# Patient Record
Sex: Male | Born: 1945 | Race: White | Hispanic: No | Marital: Single | Smoking: Former smoker
Health system: Southern US, Community
[De-identification: ages and names within clinical notes are randomized; demographics above are authoritative.]

## PROBLEM LIST (undated history)

## (undated) DIAGNOSIS — Z789 Other specified health status: Secondary | ICD-10-CM

## (undated) DIAGNOSIS — C61 Malignant neoplasm of prostate: Secondary | ICD-10-CM

## (undated) DIAGNOSIS — Z86010 Personal history of colonic polyps: Secondary | ICD-10-CM

## (undated) DIAGNOSIS — Z973 Presence of spectacles and contact lenses: Secondary | ICD-10-CM

## (undated) DIAGNOSIS — Z860101 Personal history of adenomatous and serrated colon polyps: Secondary | ICD-10-CM

## (undated) HISTORY — PX: COLONOSCOPY: SHX174

## (undated) HISTORY — PX: NO PAST SURGERIES: SHX2092

## (undated) HISTORY — DX: Other specified health status: Z78.9

## (undated) HISTORY — PX: PROSTATE BIOPSY: SHX241

---

## 1997-11-21 ENCOUNTER — Other Ambulatory Visit: Admission: RE | Admit: 1997-11-21 | Discharge: 1997-11-21 | Payer: Self-pay | Admitting: Family Medicine

## 1998-01-27 ENCOUNTER — Ambulatory Visit (HOSPITAL_COMMUNITY): Admission: RE | Admit: 1998-01-27 | Discharge: 1998-01-27 | Payer: Self-pay | Admitting: Gastroenterology

## 2001-05-31 ENCOUNTER — Emergency Department (HOSPITAL_COMMUNITY): Admission: EM | Admit: 2001-05-31 | Discharge: 2001-05-31 | Payer: Self-pay | Admitting: Emergency Medicine

## 2001-06-14 ENCOUNTER — Emergency Department (HOSPITAL_COMMUNITY): Admission: EM | Admit: 2001-06-14 | Discharge: 2001-06-14 | Payer: Self-pay | Admitting: Emergency Medicine

## 2001-07-31 ENCOUNTER — Ambulatory Visit (HOSPITAL_COMMUNITY): Admission: RE | Admit: 2001-07-31 | Discharge: 2001-07-31 | Payer: Self-pay | Admitting: Gastroenterology

## 2001-07-31 ENCOUNTER — Encounter (INDEPENDENT_AMBULATORY_CARE_PROVIDER_SITE_OTHER): Payer: Self-pay

## 2002-02-12 ENCOUNTER — Encounter: Admission: RE | Admit: 2002-02-12 | Discharge: 2002-02-12 | Payer: Self-pay | Admitting: Family Medicine

## 2002-02-12 ENCOUNTER — Encounter: Payer: Self-pay | Admitting: Family Medicine

## 2005-01-05 ENCOUNTER — Ambulatory Visit (HOSPITAL_COMMUNITY): Admission: RE | Admit: 2005-01-05 | Discharge: 2005-01-05 | Payer: Self-pay | Admitting: Gastroenterology

## 2007-04-22 ENCOUNTER — Emergency Department (HOSPITAL_COMMUNITY): Admission: EM | Admit: 2007-04-22 | Discharge: 2007-04-22 | Payer: Self-pay | Admitting: Emergency Medicine

## 2010-07-31 NOTE — Procedures (Signed)
[facility]  Patient:    Craig Peters, Craig Peters Visit Number: [id number] MRN: [id number]          Service Type: END Location: ENDO Attending Physician:  Louie Bun Dictated by:   Everardo All Madilyn Fireman, M.D. Proc. Date: 07/31/01 Admit Date:  07/31/2001   CC:         Melvyn Neth D. Clovis Riley, M.D.   Procedure Report  PROCEDURE:  Colonoscopy with polypectomy.  INDICATION FOR PROCEDURE:  History of adenomatous colon polyps on initial colonoscopy three years ago.  DESCRIPTION OF PROCEDURE:  The patient was placed in the left lateral decubitus position and placed on the pulse monitor with continuous low-flow oxygen delivered by nasal cannula.  He was sedated with 60 mg of IV Demerol and 7 mg of IV Versed.  The Olympus video colonoscope was inserted into the rectum and advanced to the cecum, confirmed by transillumination at McBurneys point and visualization of the ileocecal valve and appendiceal orifice.  The prep was excellent.  Within the base of the cecum there was a sessile 1 x 1.5 cm polyp that was removed by snare.  The remainder of the cecum, ascending, and transverse colon appeared normal with no further polyps, masses, diverticula, or other mucosal abnormalities.  Within the descending colon there were two round, sessile polyps at about 45 and 40 cm, respectively, both of which were removed by snare and sent in a separate specimen container.  A fourth polyp was seen in the mid sigmoid colon and removed by hot biopsy and sent in the second specimen container as well. Otherwise, the descending, sigmoid, and rectum appeared normal.  The colonoscope was then withdrawn, and the patient returned to the recovery room in stable condition.  He tolerated the procedure well, and there were no immediate complications.  IMPRESSION:  Four colon polyps.  PLAN:  Await histology and will probably repeat colonoscopy in three years unless a malignancy found. Dictated  by:   Everardo All Madilyn Fireman, M.D. Attending Physician:  Louie Bun DD:  07/31/01 TD:  07/31/01 Job: [id number]

## 2010-07-31 NOTE — Op Note (Signed)
NAMEABB, GOBERT            ACCOUNT NO.:  [id number]   MEDICAL RECORD NO.:  [id number]          PATIENT TYPE:  AMB   LOCATION:  ENDO                         FACILITY:  [facility]   PHYSICIAN:  John C. Madilyn Fireman, M.D.    DATE OF BIRTH:  [birth year]   DATE OF PROCEDURE:  DATE OF DISCHARGE:  01/05/2005                                 OPERATIVE REPORT   PROCEDURE:  Colonoscopy.   ENDOSCOPIST:  Everardo All. Madilyn Fireman, M.D.   INDICATIONS FOR PROCEDURE:  History of adenomatous colon polyps due for  surveillance.   DESCRIPTION OF PROCEDURE:  The patient was placed in the left lateral  decubitus position and placed on the pulse monitor with continuous low-flow  oxygen delivered by nasal cannula. He was sedated with 75 mcg IV fentanyl  and 6 mg of IV Versed.  The Olympus video colonoscope was inserted into the  rectum and advanced to cecum, confirmed by transillumination of McBurney's  point and visualization of the ileocecal valve and appendiceal orifice.  The  prep was excellent. The cecum, ascending and transverse colon all appeared  normal with no masses, polyps, diverticula or other mucosal abnormalities.  In the descending and sigmoid colon there was seen multiple scattered  diverticula and no other abnormalities. The rectum appeared normal, and  retroflexed view of the anus revealed no obvious internal hemorrhoids. The  scope was then withdrawn, and the patient returned to the recovery room in  stable condition.  He tolerated the procedure well, and there were no  immediate complications.   IMPRESSION:  Diverticulosis, otherwise normal study.   PLAN:  1.  Repeat colonoscopy in five years.  2.  The patient has a history of polyps.           ______________________________  Everardo All. Madilyn Fireman, M.D.     JCH/MEDQ  D:  01/05/2005  T:  01/05/2005  Job:  [id number]   cc:   L. Lupe Carney, M.D.  Fax: [phone]

## 2010-11-04 ENCOUNTER — Other Ambulatory Visit: Payer: Self-pay | Admitting: Gastroenterology

## 2010-12-04 LAB — I-STAT 8, (EC8 V) (CONVERTED LAB)
Acid-Base Excess: 2
BUN: 15
Bicarbonate: 28.9 — ABNORMAL HIGH
Chloride: 104
Glucose, Bld: 95
HCT: 46
Hemoglobin: 15.6
Operator id: 284141
Potassium: 4.5
Sodium: 137
TCO2: 30
pCO2, Ven: 52.6 — ABNORMAL HIGH
pH, Ven: 7.348 — ABNORMAL HIGH

## 2010-12-04 LAB — COMPREHENSIVE METABOLIC PANEL
ALT: 20
AST: 24
Albumin: 4
Alkaline Phosphatase: 48
BUN: 15
CO2: 28
Calcium: 9
Chloride: 98
Creatinine, Ser: 0.79
GFR calc Af Amer: >60
GFR calc non Af Amer: >60
Glucose, Bld: 95
Potassium: 4.2
Sodium: 132 — ABNORMAL LOW
Total Bilirubin: 2.1 — ABNORMAL HIGH
Total Protein: 6.5

## 2010-12-04 LAB — DIFFERENTIAL
Basophils Absolute: 0
Basophils Relative: 0
Eosinophils Absolute: 0.1
Eosinophils Relative: 1
Lymphocytes Relative: 20
Lymphs Abs: 1.7
Monocytes Absolute: 0.4
Monocytes Relative: 5
Neutro Abs: 6.1
Neutrophils Relative %: 74

## 2010-12-04 LAB — CBC
HCT: 43.4
Hemoglobin: 14.8
MCHC: 34.1
MCV: 92.7
Platelets: 250
RBC: 4.68
RDW: 13.3
WBC: 8.3

## 2010-12-04 LAB — POCT CARDIAC MARKERS
CKMB, poc: <1 — ABNORMAL LOW
Myoglobin, poc: 57.4
Operator id: 284141
Troponin i, poc: <0.05

## 2014-01-28 ENCOUNTER — Ambulatory Visit
Admission: RE | Admit: 2014-01-28 | Discharge: 2014-01-28 | Disposition: A | Discharge status: Home / Self Care | Payer: Commercial Managed Care - HMO | Source: Ambulatory Visit | Attending: Sports Medicine | Admitting: Sports Medicine

## 2014-01-28 ENCOUNTER — Encounter: Payer: Self-pay | Admitting: Family Medicine

## 2014-01-28 ENCOUNTER — Ambulatory Visit (INDEPENDENT_AMBULATORY_CARE_PROVIDER_SITE_OTHER): Payer: Commercial Managed Care - HMO | Admitting: Sports Medicine

## 2014-01-28 VITALS — BP 157/91 | Ht 73.0 in | Wt 180.0 lb

## 2014-01-28 DIAGNOSIS — M25511 Pain in right shoulder: Secondary | ICD-10-CM | POA: Insufficient documentation

## 2014-01-28 DIAGNOSIS — M25552 Pain in left hip: Secondary | ICD-10-CM | POA: Insufficient documentation

## 2014-01-28 NOTE — Progress Notes (Addendum)
Subjective:    Patient ID: Craig Peters, male    DOB: [birth year], 68 y.o.   MRN: [id number]  HPI: Pt is a 68yo right-hand-dominant male who presents to clinic for 1 year of left hip pain and right shoulder pain, more bothersome for the past month. His major complaint is that he feels as though his hip pain affects his walking to the point that he "looks crippled" when he walks. Pt states both problems have been bothering him for a year, hip worse than shoulder, since overexerting himself at the gym, though he does not identify specific individual injuries or trauma; pt has since changed gyms and goes to his current gym 5-6 days per week. The gym owner develops monthly routines for him that focus on "rehabing" his hip and shoulder. Pt also does targeted / trigger point massage himself. He states that he has been able to "get better at times, feel better, then get cocky and re-aggravate" things. He has not taken any medications for either issue, though he does state "I don't limp at all after a beer or two."  Left hip pain - worst with anterior flexion and at the end of pushing off with his left foot - denies swelling, falls, popping / catching or unstable gait but does endorse pain with gait, as above - denies lateral hip pain, pain with lying down, or inability to lie on his left side - denies knee or foot pain but states when his hip pain is at its worst, his calf and foot might get sore from "compensating"  Right shoulder pain - constant discomfort, located in "point" of shoulder anteriorly and just at "base" of shoulder blade posteriorly - describes worst discomfort lifting his arm over his head - describes waking up with his arm under his pillow and having trouble getting it out from under the pillow - denies frank weakness, numbness, or pain radiating down into arms or into hands - denies left-sided difficulty  History reviewed. No pertinent past medical history, no prior surgeries, no  current medications, no known drug allergies. Pt is a nonsmoker and drinks EtOH "on occasion."  Review of Systems: As above. Otherwise, full 12-system ROS was reviewed and all negative. Pt states he "feels great" otherwise.     Objective:   Physical Exam BP 157/91 mmHg  Ht 6\' 1"  (1.854 m)  Wt 180 lb (81.647 kg)  BMI 23.75 kg/m2 Gen: well-appearing elderly adult male in NAD, well-developed and appears younger than stated age MSK: Hips: bilateral normal appearance and only minimal tenderness to palpation over high anteriomedial thigh at connection to groin  No groin masses appreciated, no bruising / broken skin, no swelling  Passive ROM limited in internal rotation of the left hip with reproduction of pain  Slight weakness with resisted anterior hip flexion and left hip abduction with resisted active ROM  Right hip normal by comparison  Shoulders: bilateral normal appearance of shoulders without deformity or effusions  Normal bilateral scapular movement with pt working shoulders through full ROM actively  Passive and active ROM full bilaterally in all shoulder planes with exception of about 5-10 degrees less ext rotation of right shoulder compared to left  Painful arc on left with at about 70-80 degrees of forward flexion at left shoulder  Negative empty can test bilaterally, negative Hawkins test bilaterally  Positive Neer impingement pain and equivocal O'Brien test on left  Left shoulder normal by comparison  Neurovascular: alert, oriented, sensation and strength intact throughout  extremities  Gait slightly antalgic but neutral otherwise, without Trendelenburg sign  Distal pulses equal / symmetric bilaterally, feet and hands well-perfused     Assessment & Plan:  68yo very fit, active male with left hip and right shoulder pain, 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 Delineate further treatment at that time.  Emmaline Kluver, MD PGY-3, [facility] 01/28/2014, 10:10 AM    Addendum: X-rays of the left hip and right shoulder are reviewed. 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 X-rays of the right shoulder show mild glenohumeral DJD with what looks like a couple of loose bodies. In regards to the right shoulder his symptoms are not significant enough that he wants to consider any sort of aggressive treatment. For the left hip osteoarthritis I discussed treatment options with him including a 6 week course of glucosamine/chondroitin sulfate, cortisone injection, and total hip arthroplasty. Patient is definitely not interested in anything surgical at this point. He would like to try the glucosamine for 6 weeks. He will follow-up with me at the end of that time for check on his progress and to review his x-rays. If symptoms persist he may want to consider a cortisone injection at that time.

## 2014-03-12 ENCOUNTER — Ambulatory Visit (INDEPENDENT_AMBULATORY_CARE_PROVIDER_SITE_OTHER): Payer: Commercial Managed Care - HMO | Admitting: Sports Medicine

## 2014-03-12 ENCOUNTER — Encounter: Payer: Self-pay | Admitting: Sports Medicine

## 2014-03-12 VITALS — BP 144/90 | HR 65 | Ht 73.0 in | Wt 180.0 lb

## 2014-03-12 DIAGNOSIS — M1612 Unilateral primary osteoarthritis, left hip: Secondary | ICD-10-CM

## 2014-03-12 NOTE — Progress Notes (Signed)
Patient ID: Craig Peters, male   DOB: Apr 05, 1945, 68 y.o.   MRN: [id number]  Patient comes in today for follow-up on left hip and right shoulder pain. X-rays of both joints show degenerative changes particularly in the left hip. I recommended that he try a 6 week course of glucosamine and chondroitin. For the first 4 weeks he did not notice much benefit but for the past 2 weeks he has noticed some improvement. He has also modified his workouts in the gym with the help of his personal trainer. He is not "pushing it as hard" and he feels like that has made a difference. Physical exam was not repeated today. We simply talked about his prognosis. My recommendation is that he continue on the glucosamine for total of 6 months. He is also asking about cherry juice and I think that this is safe for him to try as well. The bottom line is that pain will be his guide and will dictate how aggressive we need to get with treatment. He does understand the importance of continuing with a modified workout program to make sure that he maintains strength but does not overly tax his joints. Follow-up with me when necessary.

## 2015-01-31 DIAGNOSIS — H25041 Posterior subcapsular polar age-related cataract, right eye: Secondary | ICD-10-CM | POA: Diagnosis not present

## 2015-01-31 DIAGNOSIS — Z0100 Encounter for examination of eyes and vision without abnormal findings: Secondary | ICD-10-CM | POA: Diagnosis not present

## 2015-01-31 DIAGNOSIS — H25013 Cortical age-related cataract, bilateral: Secondary | ICD-10-CM | POA: Diagnosis not present

## 2015-01-31 DIAGNOSIS — H2513 Age-related nuclear cataract, bilateral: Secondary | ICD-10-CM | POA: Diagnosis not present

## 2015-01-31 DIAGNOSIS — H35373 Puckering of macula, bilateral: Secondary | ICD-10-CM | POA: Diagnosis not present

## 2016-01-04 IMAGING — CR DG HIP 1V*L*
1 series · 1 of 1 positions shown · non-contrast
Comparison: AP pelvis same date dictated separately.

CLINICAL DATA: 68-year-old male with left hip pain for several
months. No trauma. periopercular

EXAM:
LEFT HIP - 1 VIEW:

[t hip frog leg left]
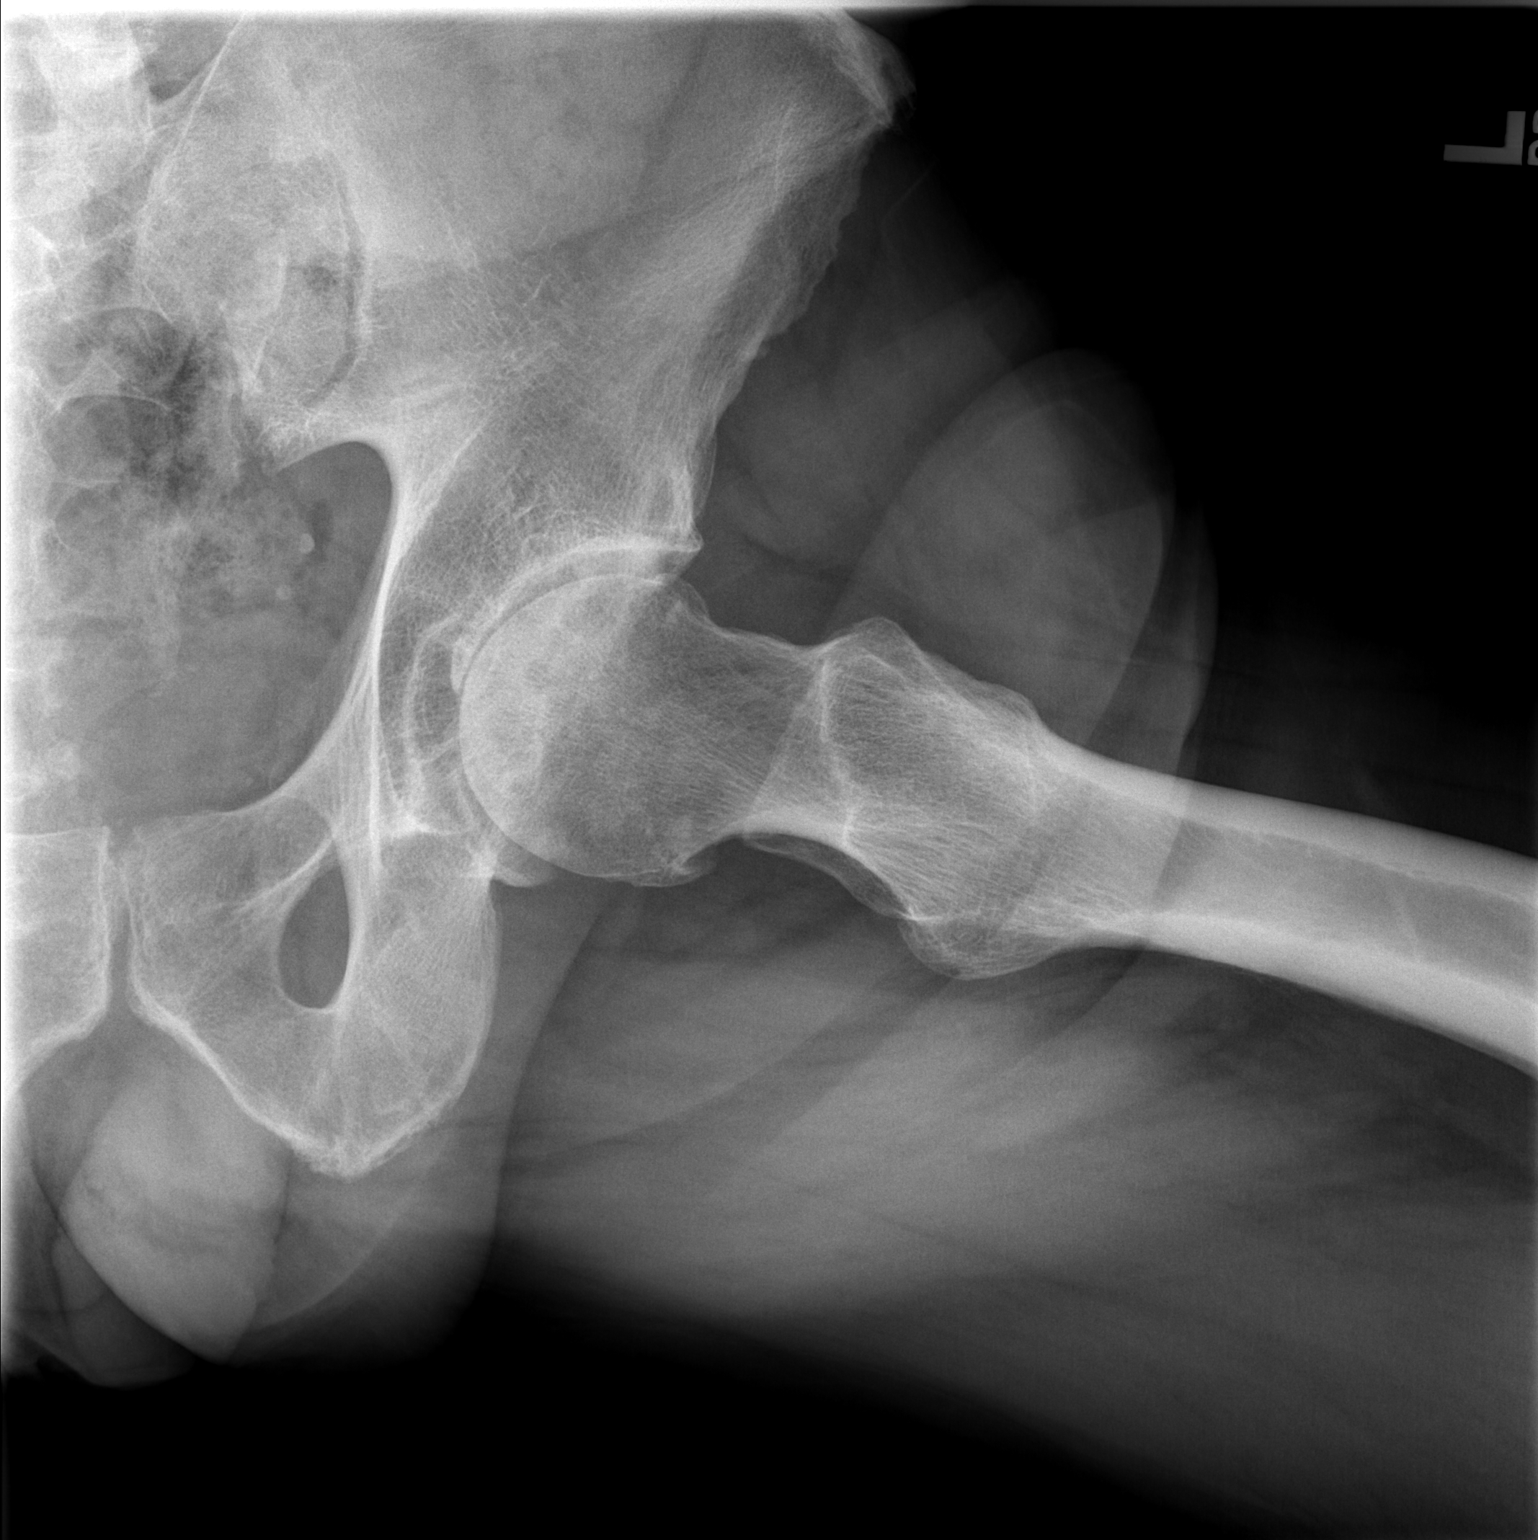

[1 of 1 positions shown; findings below may reference images not displayed]

FINDINGS: Moderate to marked left hip joint degenerative changes without plain
film evidence of avascular necrosis. Subchondral cystic changes
suspected.
IMPRESSION: Moderate to marked left hip joint degenerative changes.

## 2016-08-12 DIAGNOSIS — Z0000 Encounter for general adult medical examination without abnormal findings: Secondary | ICD-10-CM | POA: Diagnosis not present

## 2016-08-12 DIAGNOSIS — Z125 Encounter for screening for malignant neoplasm of prostate: Secondary | ICD-10-CM | POA: Diagnosis not present

## 2016-08-12 DIAGNOSIS — Z136 Encounter for screening for cardiovascular disorders: Secondary | ICD-10-CM | POA: Diagnosis not present

## 2016-08-12 DIAGNOSIS — M25559 Pain in unspecified hip: Secondary | ICD-10-CM | POA: Diagnosis not present

## 2016-08-12 DIAGNOSIS — Z13220 Encounter for screening for lipoid disorders: Secondary | ICD-10-CM | POA: Diagnosis not present

## 2016-08-12 DIAGNOSIS — Z23 Encounter for immunization: Secondary | ICD-10-CM | POA: Diagnosis not present

## 2016-08-12 DIAGNOSIS — Z86010 Personal history of colonic polyps: Secondary | ICD-10-CM | POA: Diagnosis not present

## 2016-08-12 DIAGNOSIS — R634 Abnormal weight loss: Secondary | ICD-10-CM | POA: Diagnosis not present

## 2016-08-12 DIAGNOSIS — R361 Hematospermia: Secondary | ICD-10-CM | POA: Diagnosis not present

## 2016-09-24 DIAGNOSIS — H25013 Cortical age-related cataract, bilateral: Secondary | ICD-10-CM | POA: Diagnosis not present

## 2016-09-24 DIAGNOSIS — H2513 Age-related nuclear cataract, bilateral: Secondary | ICD-10-CM | POA: Diagnosis not present

## 2016-09-24 DIAGNOSIS — Z0100 Encounter for examination of eyes and vision without abnormal findings: Secondary | ICD-10-CM | POA: Diagnosis not present

## 2016-09-24 DIAGNOSIS — H25041 Posterior subcapsular polar age-related cataract, right eye: Secondary | ICD-10-CM | POA: Diagnosis not present

## 2016-11-12 DIAGNOSIS — R972 Elevated prostate specific antigen [PSA]: Secondary | ICD-10-CM | POA: Diagnosis not present

## 2017-08-15 DIAGNOSIS — L570 Actinic keratosis: Secondary | ICD-10-CM | POA: Diagnosis not present

## 2017-08-15 DIAGNOSIS — Z125 Encounter for screening for malignant neoplasm of prostate: Secondary | ICD-10-CM | POA: Diagnosis not present

## 2017-08-15 DIAGNOSIS — Z0000 Encounter for general adult medical examination without abnormal findings: Secondary | ICD-10-CM | POA: Diagnosis not present

## 2017-08-15 DIAGNOSIS — Z131 Encounter for screening for diabetes mellitus: Secondary | ICD-10-CM | POA: Diagnosis not present

## 2017-08-15 DIAGNOSIS — Z86010 Personal history of colonic polyps: Secondary | ICD-10-CM | POA: Diagnosis not present

## 2017-08-15 DIAGNOSIS — M25559 Pain in unspecified hip: Secondary | ICD-10-CM | POA: Diagnosis not present

## 2017-08-17 DIAGNOSIS — Z0100 Encounter for examination of eyes and vision without abnormal findings: Secondary | ICD-10-CM | POA: Diagnosis not present

## 2019-03-21 ENCOUNTER — Ambulatory Visit: Payer: Commercial Managed Care - HMO | Admitting: Radiation Oncology

## 2019-06-22 ENCOUNTER — Ambulatory Visit: Payer: Self-pay | Attending: Internal Medicine

## 2019-06-22 DIAGNOSIS — Z23 Encounter for immunization: Secondary | ICD-10-CM

## 2019-06-22 NOTE — Progress Notes (Signed)
   Covid-19 Vaccination Clinic  Name:  NIMAI BYARS    MRN: [id number] DOB: 1945/12/21  06/22/2019  Mr. Kolodziej was observed post Covid-19 immunization for 15 minutes without incident. He was provided with Vaccine Information Sheet and instruction to access the V-Safe system.   Mr. Awad was instructed to call 911 with any severe reactions post vaccine: Marland Kitchen Difficulty breathing  . Swelling of face and throat  . A fast heartbeat  . A bad rash all over body  . Dizziness and weakness   Immunizations Administered    Name Date Dose VIS Date Route   Pfizer COVID-19 Vaccine 06/22/2019  8:13 AM 0.3 mL 02/23/2019 Intramuscular   Manufacturer: [place]   Lot: [id number]   [place]: [id number]

## 2019-07-16 ENCOUNTER — Ambulatory Visit: Payer: Self-pay | Attending: Internal Medicine

## 2019-07-16 DIAGNOSIS — Z23 Encounter for immunization: Secondary | ICD-10-CM

## 2019-07-16 NOTE — Progress Notes (Signed)
   Covid-19 Vaccination Clinic  Name:  Craig Peters    MRN: [id number] DOB: 1946/02/06  07/16/2019  [name] was observed post Covid-19 immunization for 15 minutes without incident. He was provided with Vaccine Information Sheet and instruction to access the V-Safe system.   [name] was instructed to call 911 with any severe reactions post vaccine: Marland Kitchen Difficulty breathing  . Swelling of face and throat  . A fast heartbeat  . A bad rash all over body  . Dizziness and weakness   Immunizations Administered    Name Date Dose VIS Date Route   Pfizer COVID-19 Vaccine 07/16/2019  8:40 AM 0.3 mL 05/09/2018 Intramuscular   Manufacturer: [place]   Lot: [id number]   [place]: [id number]

## 2019-12-24 ENCOUNTER — Encounter: Payer: Self-pay | Admitting: Radiation Oncology

## 2019-12-24 DIAGNOSIS — C61 Malignant neoplasm of prostate: Secondary | ICD-10-CM | POA: Insufficient documentation

## 2019-12-24 NOTE — Progress Notes (Signed)
GU Location of Tumor / Histology: prostatic adenocarcinoma  If Prostate Cancer, Gleason Score is (3 + 4) and PSA is (7.12). Prostate volume: 46.48 grams.  Craig Peters with a history of elevated PSA dating back to 2018. Unfortunately, patient wasn't evaluated by a urologist until 09/13/2019.   08/23/2019 psa 7.12 08/15/2017 psa 4.12 08/12/2016 psa 5.89  Biopsies of prostate (if applicable) revealed:    Past/Anticipated interventions by urology, if any: prostate biopsy, referral for consideration of brachytherapy  Past/Anticipated interventions by medical oncology, if any: no  Weight changes, if any: no  Bowel/Bladder complaints, if any: IPSS 3. SHIM 22. Denies dysuria, hematuria, urinary leakage or incontinence. Denies any bowel complaints.  Nausea/Vomiting, if any: no  Pain issues, if any:  denies  SAFETY ISSUES:  Prior radiation? denies  Pacemaker/ICD? denies  Possible current pregnancy? no, male patient  Is the patient on methotrexate? no  Current Complaints / other details:  74 year old male. Single. Resides in [place]. Paternal grandmother with history of colon ca. BP elevated. Patient denies headache, dizziness, or chest pain. Denies taking bp medication. Reports having a yearly physical with Dr. Donnie Coffin at [facility].

## 2019-12-25 ENCOUNTER — Other Ambulatory Visit: Payer: Self-pay

## 2019-12-25 ENCOUNTER — Ambulatory Visit
Admission: RE | Admit: 2019-12-25 | Discharge: 2019-12-25 | Disposition: A | Discharge status: Home / Self Care | Payer: PRIVATE HEALTH INSURANCE | Source: Ambulatory Visit | Attending: Radiation Oncology | Admitting: Radiation Oncology

## 2019-12-25 ENCOUNTER — Encounter: Payer: Self-pay | Admitting: Radiation Oncology

## 2019-12-25 ENCOUNTER — Encounter: Payer: Self-pay | Admitting: Medical Oncology

## 2019-12-25 VITALS — BP 155/118 | HR 64 | Temp 98.0°F | Resp 18 | Ht 73.0 in | Wt 174.4 lb

## 2019-12-25 DIAGNOSIS — Z800 Family history of malignant neoplasm of digestive organs: Secondary | ICD-10-CM | POA: Insufficient documentation

## 2019-12-25 DIAGNOSIS — C61 Malignant neoplasm of prostate: Secondary | ICD-10-CM | POA: Insufficient documentation

## 2019-12-25 DIAGNOSIS — Z803 Family history of malignant neoplasm of breast: Secondary | ICD-10-CM | POA: Diagnosis not present

## 2019-12-25 HISTORY — DX: Malignant neoplasm of prostate: C61

## 2019-12-25 NOTE — Progress Notes (Signed)
Introduced myself to patient as the prostate nurse navigator and discussed my role. He states he is not interested in surgery but here to learn about his radiation options. He has done some research and is leaning toward external beam but I encouraged him to learn about all his options, so he can make the best informed decision. We discussed brachytherapy and external beam and I answered his questions. He asked if he could possibly postpone treatment until January due to needing to select a different insurance plan. I asked him to discuss with Dr. Tammi Klippel.  I gave him my business card and asked him to call me with questions or concerns. He voiced understanding.

## 2019-12-25 NOTE — Progress Notes (Signed)
Radiation Oncology         [phone] ________________________________  Initial outpatient Consultation  Name: Craig Peters MRN: [id number]  Date: 12/25/2019  DOB: 1946/02/06  CC:Mitchell, L.Marlou Sa, MD  Craig Mallow, MD   REFERRING PHYSICIAN: Lucas Mallow, MD  DIAGNOSIS: 74 y.o. gentleman with Stage T1c adenocarcinoma of the prostate with Gleason score of 3+4, and PSA of 7.12.    ICD-10-CM   1. Malignant neoplasm of prostate ([place])  C61     HISTORY OF PRESENT ILLNESS: Craig Peters is a 74 y.o. male with a diagnosis of prostate cancer. He has a history of elevated PSA dating back to at least 07/2016.  More recently, he was noted to have an elevated PSA of 7.12 by his primary care physician, Dr. Alroy Dust.  Accordingly, he was referred for evaluation in urology by Dr. Gloriann Loan on 09/13/19,  digital rectal examination was performed at that time revealing no nodules.  The patient proceeded to transrectal ultrasound with 12 biopsies of the prostate on 11/13/19.  The prostate volume measured 46.48 cc.  Out of 12 core biopsies, 5 were positive.  The maximum Gleason score was 3+4, and this was seen in left base lateral (small focus), left mid lateral, left apex lateral, and left apex.  Additionally, a small focus of Gleason 3+3 disease was seen in right base lateral.  The patient reviewed the biopsy results with his urologist and he has kindly been referred today for discussion of potential radiation treatment options.   PREVIOUS RADIATION THERAPY: No  PAST MEDICAL HISTORY:  Past Medical History:  Diagnosis Date  . Medical history non-contributory   . Prostate cancer ([place])       PAST SURGICAL HISTORY: Past Surgical History:  Procedure Laterality Date  . NO PAST SURGERIES    . PROSTATE BIOPSY      FAMILY HISTORY:  Family History  Problem Relation Age of Onset  . Prostate cancer Neg Hx   . Colon cancer Neg Hx   . Pancreatic cancer Neg Hx   . Breast cancer Neg Hx      SOCIAL HISTORY:  Social History   Socioeconomic History  . Marital status: Single    Spouse name: Not on file  . Number of children: Not on file  . Years of education: Not on file  . Highest education level: Not on file  Occupational History  . Not on file  Tobacco Use  . Smoking status: Never Smoker  . Smokeless tobacco: Never Used  Vaping Use  . Vaping Use: Never used  Substance and Sexual Activity  . Alcohol use: Yes    Alcohol/week: 0.0 standard drinks  . Drug use: No  . Sexual activity: Not Currently  Other Topics Concern  . Not on file  Social History Narrative  . Not on file   Social Determinants of Health   Financial Resource Strain:   . Difficulty of Paying Living Expenses: Not on file  Food Insecurity:   . Worried About Charity fundraiser in the Last Year: Not on file  . Ran Out of Food in the Last Year: Not on file  Transportation Needs:   . Lack of Transportation (Medical): Not on file  . Lack of Transportation (Non-Medical): Not on file  Physical Activity:   . Days of Exercise per Week: Not on file  . Minutes of Exercise per Session: Not on file  Stress:   . Feeling of Stress : Not on file  Social Connections:   . Frequency of Communication with Friends and Family: Not on file  . Frequency of Social Gatherings with Friends and Family: Not on file  . Attends Religious Services: Not on file  . Active Member of Clubs or Organizations: Not on file  . Attends Archivist Meetings: Not on file  . Marital Status: Not on file  Intimate Partner Violence:   . Fear of Current or Ex-Partner: Not on file  . Emotionally Abused: Not on file  . Physically Abused: Not on file  . Sexually Abused: Not on file    ALLERGIES: Patient has no known allergies.  MEDICATIONS:  No current outpatient medications on file.   No current facility-administered medications for this encounter.    REVIEW OF SYSTEMS:  On review of systems, the patient reports  that he is doing well overall. He denies any chest pain, shortness of breath, cough, fevers, chills, night sweats, unintended weight changes. He denies any bowel disturbances, and denies abdominal pain, nausea or vomiting. He denies any new musculoskeletal or joint aches or pains. His IPSS was 3, indicating mild urinary symptoms. His SHIM was 22, indicating he does not have erectile dysfunction. A complete review of systems is obtained and is otherwise negative.    PHYSICAL EXAM:  Wt Readings from Last 3 Encounters:  03/12/14 180 lb (81.6 kg)  01/28/14 180 lb (81.6 kg)   Temp Readings from Last 3 Encounters:  No data found for Temp   BP Readings from Last 3 Encounters:  03/12/14 (!) 144/90  01/28/14 (!) 157/91   Pulse Readings from Last 3 Encounters:  03/12/14 65    /10  In general this is a well appearing man in no acute distress. He's alert and oriented x4 and appropriate throughout the examination. Cardiopulmonary assessment is negative for acute distress and he exhibits normal effort.    KPS = 100  100 - Normal; no complaints; no evidence of disease. 90   - Able to carry on normal activity; minor signs or symptoms of disease. 80   - Normal activity with effort; some signs or symptoms of disease. 64   - Cares for self; unable to carry on normal activity or to do active work. 60   - Requires occasional assistance, but is able to care for most of his personal needs. 50   - Requires considerable assistance and frequent medical care. 30   - Disabled; requires special care and assistance. 61   - Severely disabled; hospital admission is indicated although death not imminent. 65   - Very sick; hospital admission necessary; active supportive treatment necessary. 10   - Moribund; fatal processes progressing rapidly. 0     - Dead  Karnofsky DA, Abelmann Merna, Craver LS and Burchenal [facility] [phone]) The use of the nitrogen mustards in the palliative treatment of carcinoma: with particular  reference to bronchogenic carcinoma Cancer 1 634-56  LABORATORY DATA:  Lab Results  Component Value Date   WBC 8.3 04/22/2007   HGB 14.8 04/22/2007   HCT 43.4 04/22/2007   MCV 92.7 04/22/2007   PLT 250 04/22/2007   Lab Results  Component Value Date   NA 132 (L) 04/22/2007   K 4.2 04/22/2007   CL 98 04/22/2007   CO2 28 04/22/2007   Lab Results  Component Value Date   ALT 20 04/22/2007   AST 24 04/22/2007   ALKPHOS 48 04/22/2007   BILITOT 2.1 (H) 04/22/2007     RADIOGRAPHY: No results found.  IMPRESSION/PLAN: 1. 74 y.o. gentleman with Stage T1c adenocarcinoma of the prostate with Gleason Score of 3+4, and PSA of 7.12. We discussed the patient's workup and outlined the nature of prostate cancer in this setting. The patient's T stage, Gleason's score, and PSA put him into the favorable intermediate risk group. Accordingly, he is eligible for a variety of potential treatment options including brachytherapy, 5.5 weeks of external radiation, or prostatectomy. We discussed the available radiation techniques, and focused on the details and logistics of delivery. We discussed and outlined the risks, benefits, short and long-term effects associated with radiotherapy and compared and contrasted these with prostatectomy. We discussed the role of SpaceOAR gel in reducing the rectal toxicity associated with radiotherapy.  He appears to have a good understanding of his disease and our treatment recommendations which are of curative intent.  He was encouraged to ask questions that were answered to his stated satisfaction.  At the conclusion of our conversation, the patient is interested in moving forward with prostate seed implant early in 2022.  I spent 60 minutes total on this encounter.   ------------------------------------------------   Tyler Pita, MD [place] Director and Director of Stereotactic Radiosurgery Direct Dial: [phone]  Fax:  [phone] .com  Skype  [email or web address]   This document serves as a record of services personally performed by Tyler Pita, MD. It was created on his behalf by Wilburn Mylar, a trained medical scribe. The creation of this record is based on the scribe's personal observations and the provider's statements to them. This document has been checked and approved by the attending provider.

## 2019-12-31 ENCOUNTER — Telehealth: Payer: Self-pay | Admitting: Radiation Oncology

## 2019-12-31 NOTE — Telephone Encounter (Signed)
Patient phoned back. Patient verbalized he is trying to decide between brachytherapy and external beam therapy. Patient explains his understanding that brachytherapy is approximately 25,000 and EBRT 55,000. Patient question if he is able to get something in writing detailing this. Deferred question to financial counselor, Ailene Ravel Hobgood.

## 2019-12-31 NOTE — Telephone Encounter (Signed)
Received voicemail message from patient requesting return call. Phoned patient to inquire. No answer. Left voicemail message requesting return call. Reinforced that my voicemail is secure should he want to leave details about his needs for a more prompt resolution. Awaiting return call.

## 2020-01-02 ENCOUNTER — Telehealth: Payer: Self-pay | Admitting: *Deleted

## 2020-01-02 NOTE — Telephone Encounter (Signed)
RETURNED PATIENT'S PHONE CALL, LVM FOR A RETURN CALL 

## 2020-01-29 ENCOUNTER — Telehealth: Payer: Self-pay | Admitting: *Deleted

## 2020-01-29 NOTE — Telephone Encounter (Signed)
RETURNED PT. PHONE CALL, LVM FOR A RETURN CALL

## 2020-02-06 ENCOUNTER — Other Ambulatory Visit: Payer: Self-pay | Admitting: Urology

## 2020-02-14 ENCOUNTER — Ambulatory Visit: Payer: Self-pay | Admitting: Urology

## 2020-02-14 ENCOUNTER — Ambulatory Visit: Payer: PRIVATE HEALTH INSURANCE | Admitting: Radiation Oncology

## 2020-02-15 ENCOUNTER — Ambulatory Visit: Payer: PRIVATE HEALTH INSURANCE

## 2020-03-01 ENCOUNTER — Ambulatory Visit: Payer: Medicare (Managed Care) | Attending: Internal Medicine

## 2020-03-01 DIAGNOSIS — Z23 Encounter for immunization: Secondary | ICD-10-CM

## 2020-03-01 NOTE — Progress Notes (Signed)
   Covid-19 Vaccination Clinic  Name:  Craig Peters    MRN: [id number] DOB: May 13, 1945  03/01/2020  Mr. Craig Peters was observed post Covid-19 immunization for 15 minutes without incident. He was provided with Vaccine Information Sheet and instruction to access the V-Safe system.   Mr. Craig Peters was instructed to call 911 with any severe reactions post vaccine: Marland Kitchen Difficulty breathing  . Swelling of face and throat  . A fast heartbeat  . A bad rash all over body  . Dizziness and weakness   Immunizations Administered    Name Date Dose VIS Date Route   Pfizer COVID-19 Vaccine 03/01/2020  9:19 AM 0.3 mL 01/02/2020 Intramuscular   Manufacturer: [place]   Lot: [id number]   [place]: [id number]

## 2020-03-19 ENCOUNTER — Telehealth: Payer: Self-pay | Admitting: *Deleted

## 2020-03-19 NOTE — Telephone Encounter (Signed)
CALLED PATIENT TO REMIND OF PRE-SEED APPTS. FOR 03-20-20, LVM FOR A RETURN CALL

## 2020-03-19 NOTE — Progress Notes (Signed)
  Radiation Oncology         (336) [phone] ________________________________  Name: Craig Peters MRN: [id number]  Date: 03/20/2020  DOB: 13-Jan-1946  SIMULATION AND TREATMENT PLANNING NOTE PUBIC ARCH STUDY  MH:[id number], L.August Saucer, MD  Crista Elliot, MD  DIAGNOSIS:  75 y.o. gentleman with Stage T1c adenocarcinoma of the prostate with Gleason score of 3+4, and PSA of 7.12.  Oncology History   No history exists.      ICD-10-CM   1. Malignant neoplasm of prostate (HCC)  C61     COMPLEX SIMULATION:  The patient presented today for evaluation for possible prostate seed implant. He was brought to the radiation planning suite and placed supine on the CT couch. A 3-dimensional image study set was obtained in upload to the planning computer. There, on each axial slice, I contoured the prostate gland. Then, using three-dimensional radiation planning tools I reconstructed the prostate in view of the structures from the transperineal needle pathway to assess for possible pubic arch interference. In doing so, I did not appreciate any pubic arch interference. Also, the patient's prostate volume was estimated based on the drawn structure. The volume was 46 cc.  Given the pubic arch appearance and prostate volume, patient remains a good candidate to proceed with prostate seed implant. Today, he freely provided informed written consent to proceed.    PLAN: The patient will undergo prostate seed implant.   ________________________________  Artist Pais. Kathrynn Running, M.D.

## 2020-03-20 ENCOUNTER — Other Ambulatory Visit: Payer: Self-pay

## 2020-03-20 ENCOUNTER — Ambulatory Visit
Admission: RE | Admit: 2020-03-20 | Discharge: 2020-03-20 | Disposition: A | Discharge status: Home / Self Care | Payer: Medicare Other | Source: Ambulatory Visit | Attending: Urology | Admitting: Urology

## 2020-03-20 ENCOUNTER — Ambulatory Visit (HOSPITAL_COMMUNITY)
Admission: RE | Admit: 2020-03-20 | Discharge: 2020-03-20 | Disposition: A | Discharge status: Home / Self Care | Payer: Medicare Other | Source: Ambulatory Visit | Attending: Urology | Admitting: Urology

## 2020-03-20 ENCOUNTER — Encounter (HOSPITAL_COMMUNITY)
Admission: RE | Admit: 2020-03-20 | Discharge: 2020-03-20 | Disposition: A | Discharge status: Home / Self Care | Payer: Medicare Other | Source: Ambulatory Visit | Attending: Urology | Admitting: Urology

## 2020-03-20 ENCOUNTER — Ambulatory Visit
Admission: RE | Admit: 2020-03-20 | Discharge: 2020-03-20 | Disposition: A | Discharge status: Home / Self Care | Payer: Medicare Other | Source: Ambulatory Visit | Attending: Radiation Oncology | Admitting: Radiation Oncology

## 2020-03-20 DIAGNOSIS — C61 Malignant neoplasm of prostate: Secondary | ICD-10-CM

## 2020-03-20 DIAGNOSIS — Z01818 Encounter for other preprocedural examination: Secondary | ICD-10-CM | POA: Diagnosis not present

## 2020-03-21 ENCOUNTER — Telehealth: Payer: Self-pay | Admitting: *Deleted

## 2020-03-21 NOTE — Telephone Encounter (Signed)
RETURNED PATIENT'S PHONE CALL, LVM FOR A RETURN CALL 

## 2020-03-27 ENCOUNTER — Encounter: Payer: Self-pay | Admitting: Medical Oncology

## 2020-03-27 NOTE — Progress Notes (Signed)
Patient called stating he was able to connect with transportation and he is good. I asked him to call me back if I can assist him in any way. He was very appreciative of my call and voiced understanding.

## 2020-03-27 NOTE — Progress Notes (Signed)
Returning call to patient to discuss transportation. I left message asking for a return call to discuss.

## 2020-04-01 ENCOUNTER — Other Ambulatory Visit (HOSPITAL_COMMUNITY)
Admission: RE | Admit: 2020-04-01 | Discharge: 2020-04-01 | Disposition: A | Discharge status: Home / Self Care | Payer: Medicare Other | Source: Ambulatory Visit | Attending: Urology | Admitting: Urology

## 2020-04-01 ENCOUNTER — Other Ambulatory Visit: Payer: Self-pay

## 2020-04-01 ENCOUNTER — Encounter (HOSPITAL_COMMUNITY)
Admission: RE | Admit: 2020-04-01 | Discharge: 2020-04-01 | Disposition: A | Discharge status: Home / Self Care | Payer: Medicare Other | Source: Ambulatory Visit | Attending: Urology | Admitting: Urology

## 2020-04-01 DIAGNOSIS — Z20822 Contact with and (suspected) exposure to covid-19: Secondary | ICD-10-CM | POA: Insufficient documentation

## 2020-04-01 DIAGNOSIS — Z01812 Encounter for preprocedural laboratory examination: Secondary | ICD-10-CM | POA: Insufficient documentation

## 2020-04-01 LAB — COMPREHENSIVE METABOLIC PANEL
ALT: 27 U/L (ref 0–44)
AST: 26 U/L (ref 15–41)
Albumin: 4.1 g/dL (ref 3.5–5.0)
Alkaline Phosphatase: 56 U/L (ref 38–126)
Anion gap: 9 (ref 5–15)
BUN: 17 mg/dL (ref 8–23)
CO2: 26 mmol/L (ref 22–32)
Calcium: 9.4 mg/dL (ref 8.9–10.3)
Chloride: 106 mmol/L (ref 98–111)
Creatinine, Ser: 1.11 mg/dL (ref 0.61–1.24)
GFR, Estimated: >60 mL/min (ref >60)
Glucose, Bld: 107 mg/dL — ABNORMAL HIGH (ref 70–99)
Potassium: 4.3 mmol/L (ref 3.5–5.1)
Sodium: 141 mmol/L (ref 135–145)
Total Bilirubin: 1.2 mg/dL (ref 0.3–1.2)
Total Protein: 6.8 g/dL (ref 6.5–8.1)

## 2020-04-01 LAB — SARS CORONAVIRUS 2 (TAT 6-24 HRS): SARS Coronavirus 2: NEGATIVE

## 2020-04-01 LAB — CBC
HCT: 44.6 % (ref 39.0–52.0)
Hemoglobin: 15 g/dL (ref 13.0–17.0)
MCH: 31.3 pg (ref 26.0–34.0)
MCHC: 33.6 g/dL (ref 30.0–36.0)
MCV: 92.9 fL (ref 80.0–100.0)
Platelets: 276 10*3/uL (ref 150–400)
RBC: 4.8 MIL/uL (ref 4.22–5.81)
RDW: 12.9 % (ref 11.5–15.5)
WBC: 6.7 10*3/uL (ref 4.0–10.5)
nRBC: 0 % (ref 0.0–0.2)

## 2020-04-01 LAB — PROTIME-INR
INR: 1 (ref 0.8–1.2)
Prothrombin Time: 12.9 seconds (ref 11.4–15.2)

## 2020-04-01 LAB — APTT: aPTT: 26 seconds (ref 24–36)

## 2020-04-02 ENCOUNTER — Encounter (HOSPITAL_BASED_OUTPATIENT_CLINIC_OR_DEPARTMENT_OTHER): Payer: Self-pay | Admitting: Urology

## 2020-04-02 ENCOUNTER — Other Ambulatory Visit: Payer: Self-pay

## 2020-04-02 NOTE — Progress Notes (Signed)
Spoke w/ via phone for pre-op interview--- PT Lab needs dos---- no              Lab results------ pt had CBC, CMP, PT/PTT done 04-01-2020 results in epic/  Current cxr/ ekg in epic/ chart COVID test ------ done 04-01-2020 negative result in epic Arrive at ------- 1015 NPO after MN NO Solid Food.  Clear liquids from MN until--- 0915 Medications to take morning of surgery ----- NONE Diabetic medication ----- n/a Patient Special Instructions ----- will do one fleet enema morning of surgery Pre-Op special Istructions ----- [facility] dr [facility] office, transportation arranged thru [place] transportation to be pick-up at discharge and pt friend, nancy neville, will be caregiver. Patient verbalized understanding of instructions that were given at this phone interview. Patient denies shortness of breath, chest pain, fever, cough at this phone interview.

## 2020-04-04 ENCOUNTER — Ambulatory Visit (HOSPITAL_BASED_OUTPATIENT_CLINIC_OR_DEPARTMENT_OTHER): Admission: RE | Admit: 2020-04-04 | Payer: Medicare Other | Source: Home / Self Care | Admitting: Urology

## 2020-04-04 DIAGNOSIS — L918 Other hypertrophic disorders of the skin: Secondary | ICD-10-CM | POA: Diagnosis not present

## 2020-04-04 DIAGNOSIS — L570 Actinic keratosis: Secondary | ICD-10-CM | POA: Diagnosis not present

## 2020-04-04 HISTORY — DX: Presence of spectacles and contact lenses: Z97.3

## 2020-04-04 HISTORY — DX: Personal history of adenomatous and serrated colon polyps: Z86.0101

## 2020-04-04 HISTORY — DX: Personal history of colonic polyps: Z86.010

## 2020-04-04 SURGERY — INSERTION, RADIATION SOURCE, PROSTATE
Anesthesia: General

## 2020-04-07 ENCOUNTER — Other Ambulatory Visit: Payer: Self-pay | Admitting: Urology

## 2020-04-07 DIAGNOSIS — C61 Malignant neoplasm of prostate: Secondary | ICD-10-CM

## 2020-04-08 ENCOUNTER — Telehealth: Payer: Self-pay | Admitting: *Deleted

## 2020-04-08 NOTE — Telephone Encounter (Signed)
CALLED PATIENT TO INFORM OF IMPLANT DATE, SPOKE WITH PATIENT AND HE IS AWARE OF THE NEW DATE

## 2020-05-19 ENCOUNTER — Telehealth: Payer: Self-pay | Admitting: *Deleted

## 2020-05-19 NOTE — Telephone Encounter (Signed)
CALLED PATIENT TO REMIND OF LAB AND COVID TESTING FOR 05-22-20, SPOKE WITH PATIENT AND HE IS AWARE OF THESE APPTS.

## 2020-05-21 ENCOUNTER — Other Ambulatory Visit: Payer: Self-pay

## 2020-05-21 ENCOUNTER — Encounter (HOSPITAL_BASED_OUTPATIENT_CLINIC_OR_DEPARTMENT_OTHER): Payer: Self-pay | Admitting: Urology

## 2020-05-21 NOTE — Progress Notes (Signed)
Spoke w/ via phone for pre-op interview--- PT Lab needs dos---- no              Lab results------ pt getting  CBC, CMP, PT/PTT done 05-22-2020 @ 0915/  Current cxr/ ekg in epic/ chart COVID test ------ 03-10--2022 @ 1100 Arrive at ------- 1015 NPO after MN NO Solid Food.  Clear liquids from MN until--- 0915 Medications to take morning of surgery ----- NONE Diabetic medication ----- n/a Patient Special Instructions ----- will do one fleet enema morning of surgery Pre-Op special Istructions ----- per pt friend, nancy neville, will be driver/ and caregiver. Pt does not want her to have doctor talk to her after surgery. Patient verbalized understanding of instructions that were given at this phone interview. Patient denies shortness of breath, chest pain, fever, cough at this phone interview.

## 2020-05-22 ENCOUNTER — Other Ambulatory Visit (HOSPITAL_COMMUNITY)
Admission: RE | Admit: 2020-05-22 | Discharge: 2020-05-22 | Disposition: A | Discharge status: Home / Self Care | Payer: Medicare Other | Source: Ambulatory Visit | Attending: Urology | Admitting: Urology

## 2020-05-22 ENCOUNTER — Encounter (HOSPITAL_COMMUNITY)
Admission: RE | Admit: 2020-05-22 | Discharge: 2020-05-22 | Disposition: A | Discharge status: Home / Self Care | Payer: Medicare Other | Source: Ambulatory Visit | Attending: Urology | Admitting: Urology

## 2020-05-22 DIAGNOSIS — Z20822 Contact with and (suspected) exposure to covid-19: Secondary | ICD-10-CM | POA: Insufficient documentation

## 2020-05-22 DIAGNOSIS — Z01812 Encounter for preprocedural laboratory examination: Secondary | ICD-10-CM | POA: Insufficient documentation

## 2020-05-22 LAB — CBC
HCT: 44.7 % (ref 39.0–52.0)
Hemoglobin: 15.1 g/dL (ref 13.0–17.0)
MCH: 31.4 pg (ref 26.0–34.0)
MCHC: 33.8 g/dL (ref 30.0–36.0)
MCV: 92.9 fL (ref 80.0–100.0)
Platelets: 288 10*3/uL (ref 150–400)
RBC: 4.81 MIL/uL (ref 4.22–5.81)
RDW: 12.8 % (ref 11.5–15.5)
WBC: 5.6 10*3/uL (ref 4.0–10.5)
nRBC: 0 % (ref 0.0–0.2)

## 2020-05-22 LAB — COMPREHENSIVE METABOLIC PANEL
ALT: 25 U/L (ref 0–44)
AST: 25 U/L (ref 15–41)
Albumin: 4.2 g/dL (ref 3.5–5.0)
Alkaline Phosphatase: 53 U/L (ref 38–126)
Anion gap: 7 (ref 5–15)
BUN: 18 mg/dL (ref 8–23)
CO2: 27 mmol/L (ref 22–32)
Calcium: 9.3 mg/dL (ref 8.9–10.3)
Chloride: 103 mmol/L (ref 98–111)
Creatinine, Ser: 0.84 mg/dL (ref 0.61–1.24)
GFR, Estimated: >60 mL/min (ref >60)
Glucose, Bld: 103 mg/dL — ABNORMAL HIGH (ref 70–99)
Potassium: 4.8 mmol/L (ref 3.5–5.1)
Sodium: 137 mmol/L (ref 135–145)
Total Bilirubin: 2 mg/dL — ABNORMAL HIGH (ref 0.3–1.2)
Total Protein: 6.9 g/dL (ref 6.5–8.1)

## 2020-05-22 LAB — PROTIME-INR
INR: 1.1 (ref 0.8–1.2)
Prothrombin Time: 13.6 seconds (ref 11.4–15.2)

## 2020-05-22 LAB — SARS CORONAVIRUS 2 (TAT 6-24 HRS): SARS Coronavirus 2: NEGATIVE

## 2020-05-22 LAB — APTT: aPTT: 26 seconds (ref 24–36)

## 2020-05-23 ENCOUNTER — Telehealth: Payer: Self-pay | Admitting: *Deleted

## 2020-05-23 NOTE — Telephone Encounter (Signed)
CALLED PATIENT TO REMIND OF PROCEDURE FOR IMPLANT FOR 05-26-20, SPOKE WITH PATIENT AND HE IS AWARE OF THIS PROCEDURE

## 2020-05-26 ENCOUNTER — Ambulatory Visit (HOSPITAL_BASED_OUTPATIENT_CLINIC_OR_DEPARTMENT_OTHER)
Admission: RE | Admit: 2020-05-26 | Discharge: 2020-05-26 | Disposition: A | Discharge status: Home / Self Care | Payer: Medicare Other | Attending: Urology | Admitting: Urology

## 2020-05-26 ENCOUNTER — Ambulatory Visit (HOSPITAL_BASED_OUTPATIENT_CLINIC_OR_DEPARTMENT_OTHER): Payer: Medicare Other | Admitting: Anesthesiology

## 2020-05-26 ENCOUNTER — Encounter (HOSPITAL_BASED_OUTPATIENT_CLINIC_OR_DEPARTMENT_OTHER): Payer: Self-pay | Admitting: Urology

## 2020-05-26 ENCOUNTER — Other Ambulatory Visit: Payer: Self-pay

## 2020-05-26 ENCOUNTER — Ambulatory Visit (HOSPITAL_COMMUNITY): Payer: Medicare Other

## 2020-05-26 ENCOUNTER — Encounter (HOSPITAL_BASED_OUTPATIENT_CLINIC_OR_DEPARTMENT_OTHER)
Admission: RE | Disposition: A | Discharge status: Home / Self Care | Payer: Self-pay | Source: Home / Self Care | Attending: Urology

## 2020-05-26 DIAGNOSIS — Z87891 Personal history of nicotine dependence: Secondary | ICD-10-CM | POA: Diagnosis not present

## 2020-05-26 DIAGNOSIS — C61 Malignant neoplasm of prostate: Secondary | ICD-10-CM | POA: Diagnosis present

## 2020-05-26 HISTORY — PX: RADIOACTIVE SEED IMPLANT: SHX5150

## 2020-05-26 HISTORY — PX: SPACE OAR INSTILLATION: SHX6769

## 2020-05-26 SURGERY — INSERTION, RADIATION SOURCE, PROSTATE
Anesthesia: General | Site: Prostate

## 2020-05-26 MED ORDER — HYDRALAZINE HCL 20 MG/ML IJ SOLN
INTRAMUSCULAR | Status: AC
  Filled 2020-05-26: qty 1

## 2020-05-26 MED ORDER — DEXAMETHASONE SODIUM PHOSPHATE 4 MG/ML IJ SOLN
INTRAMUSCULAR | Status: DC | PRN
  Administered 2020-05-26: 8 mg via INTRAVENOUS

## 2020-05-26 MED ORDER — CIPROFLOXACIN IN D5W 400 MG/200ML IV SOLN
400.0000 mg | INTRAVENOUS | Status: AC
  Administered 2020-05-26: 400 mg via INTRAVENOUS

## 2020-05-26 MED ORDER — DOCUSATE SODIUM 100 MG PO CAPS: 100.0000 mg | ORAL_CAPSULE | Freq: Every day | ORAL | 0 refills | Status: DC | PRN

## 2020-05-26 MED ORDER — PROPOFOL 10 MG/ML IV BOLUS
INTRAVENOUS | Status: AC
  Filled 2020-05-26: qty 20

## 2020-05-26 MED ORDER — GLYCOPYRROLATE 0.2 MG/ML IJ SOLN
INTRAMUSCULAR | Status: DC | PRN
  Administered 2020-05-26: .2 mg via INTRAVENOUS

## 2020-05-26 MED ORDER — PROPOFOL 10 MG/ML IV BOLUS
INTRAVENOUS | Status: DC | PRN
  Administered 2020-05-26: 50 mg via INTRAVENOUS
  Administered 2020-05-26: 150 mg via INTRAVENOUS

## 2020-05-26 MED ORDER — CEPHALEXIN 500 MG PO CAPS: 500.0000 mg | ORAL_CAPSULE | Freq: Two times a day (BID) | ORAL | 0 refills | Status: AC

## 2020-05-26 MED ORDER — LACTATED RINGERS IV SOLN: INTRAVENOUS | Status: DC

## 2020-05-26 MED ORDER — HYDRALAZINE HCL 20 MG/ML IJ SOLN
10.0000 mg | Freq: Once | INTRAMUSCULAR | Status: AC
  Administered 2020-05-26: 10 mg via INTRAVENOUS

## 2020-05-26 MED ORDER — ONDANSETRON HCL 4 MG/2ML IJ SOLN
INTRAMUSCULAR | Status: DC | PRN
  Administered 2020-05-26: 4 mg via INTRAVENOUS

## 2020-05-26 MED ORDER — FENTANYL CITRATE (PF) 100 MCG/2ML IJ SOLN
INTRAMUSCULAR | Status: DC | PRN
  Administered 2020-05-26: 50 ug via INTRAVENOUS

## 2020-05-26 MED ORDER — IOHEXOL 300 MG/ML  SOLN
INTRAMUSCULAR | Status: DC | PRN
  Administered 2020-05-26: 7 mL

## 2020-05-26 MED ORDER — SODIUM CHLORIDE 0.9 % IV SOLN
INTRAVENOUS | Status: AC | PRN
  Administered 2020-05-26: 1000 mL

## 2020-05-26 MED ORDER — OXYCODONE-ACETAMINOPHEN 5-325 MG PO TABS: 1.0000 | ORAL_TABLET | ORAL | 0 refills | Status: DC | PRN

## 2020-05-26 MED ORDER — FENTANYL CITRATE (PF) 100 MCG/2ML IJ SOLN: 25.0000 ug | INTRAMUSCULAR | Status: DC | PRN

## 2020-05-26 MED ORDER — STERILE WATER FOR IRRIGATION IR SOLN
Status: DC | PRN
  Administered 2020-05-26: 500 mL

## 2020-05-26 MED ORDER — SODIUM CHLORIDE (PF) 0.9 % IJ SOLN
INTRAMUSCULAR | Status: DC | PRN
  Administered 2020-05-26: 10 mL via INTRAVENOUS

## 2020-05-26 MED ORDER — OXYCODONE HCL 5 MG/5ML PO SOLN: 5.0000 mg | Freq: Once | ORAL | Status: DC | PRN

## 2020-05-26 MED ORDER — FLEET ENEMA 7-19 GM/118ML RE ENEM: 1.0000 | ENEMA | Freq: Once | RECTAL | Status: DC

## 2020-05-26 MED ORDER — EPHEDRINE SULFATE 50 MG/ML IJ SOLN
INTRAMUSCULAR | Status: DC | PRN
  Administered 2020-05-26: 5 mg via INTRAVENOUS

## 2020-05-26 MED ORDER — LIDOCAINE HCL (CARDIAC) PF 100 MG/5ML IV SOSY
PREFILLED_SYRINGE | INTRAVENOUS | Status: DC | PRN
  Administered 2020-05-26: 50 mg via INTRAVENOUS

## 2020-05-26 MED ORDER — OXYCODONE HCL 5 MG PO TABS: 5.0000 mg | ORAL_TABLET | Freq: Once | ORAL | Status: DC | PRN

## 2020-05-26 MED ORDER — LIDOCAINE 2% (20 MG/ML) 5 ML SYRINGE
INTRAMUSCULAR | Status: AC
  Filled 2020-05-26: qty 5

## 2020-05-26 MED ORDER — ONDANSETRON HCL 4 MG/2ML IJ SOLN
INTRAMUSCULAR | Status: AC
  Filled 2020-05-26: qty 2

## 2020-05-26 MED ORDER — CIPROFLOXACIN IN D5W 400 MG/200ML IV SOLN
INTRAVENOUS | Status: AC
  Filled 2020-05-26: qty 200

## 2020-05-26 MED ORDER — DEXAMETHASONE SODIUM PHOSPHATE 10 MG/ML IJ SOLN
INTRAMUSCULAR | Status: AC
  Filled 2020-05-26: qty 1

## 2020-05-26 MED ORDER — ONDANSETRON HCL 4 MG/2ML IJ SOLN: 4.0000 mg | Freq: Once | INTRAMUSCULAR | Status: DC | PRN

## 2020-05-26 MED ORDER — FENTANYL CITRATE (PF) 100 MCG/2ML IJ SOLN
INTRAMUSCULAR | Status: AC
  Filled 2020-05-26: qty 2

## 2020-05-26 MED ORDER — AMISULPRIDE (ANTIEMETIC) 5 MG/2ML IV SOLN: 10.0000 mg | Freq: Once | INTRAVENOUS | Status: DC | PRN

## 2020-05-26 SURGICAL SUPPLY — 37 items
BAG DRN RND TRDRP ANRFLXCHMBR (UROLOGICAL SUPPLIES) ×2
BAG URINE DRAIN 2000ML AR STRL (UROLOGICAL SUPPLIES) ×3 IMPLANT
BLADE CLIPPER SENSICLIP SURGIC (BLADE) ×3 IMPLANT
CATH FOLEY 2WAY SLVR  5CC 16FR (CATHETERS) ×3
CATH FOLEY 2WAY SLVR 5CC 16FR (CATHETERS) ×2 IMPLANT
CATH ROBINSON RED A/P 16FR (CATHETERS) IMPLANT
CATH ROBINSON RED A/P 20FR (CATHETERS) ×3 IMPLANT
CLOTH BEACON ORANGE TIMEOUT ST (SAFETY) ×3 IMPLANT
CNTNR URN SCR LID CUP LEK RST (MISCELLANEOUS) ×4 IMPLANT
CONT SPEC 4OZ STRL OR WHT (MISCELLANEOUS) ×6
COVER BACK TABLE 60X90IN (DRAPES) ×3 IMPLANT
COVER MAYO STAND STRL (DRAPES) ×3 IMPLANT
DRAPE C-ARM 35X43 STRL (DRAPES) ×3 IMPLANT
DRSG TEGADERM 4X4.75 (GAUZE/BANDAGES/DRESSINGS) ×4 IMPLANT
DRSG TEGADERM 8X12 (GAUZE/BANDAGES/DRESSINGS) ×3 IMPLANT
GAUZE SPONGE 4X4 12PLY STRL (GAUZE/BANDAGES/DRESSINGS) ×1 IMPLANT
GLOVE SURG ENC MOIS LTX SZ6 (GLOVE) IMPLANT
GLOVE SURG ENC MOIS LTX SZ6.5 (GLOVE) IMPLANT
GLOVE SURG ENC MOIS LTX SZ7 (GLOVE) ×3 IMPLANT
GLOVE SURG ENC MOIS LTX SZ8 (GLOVE) IMPLANT
GLOVE SURG ORTHO LTX SZ8.5 (GLOVE) IMPLANT
GLOVE SURG POLYISO LF SZ6.5 (GLOVE) IMPLANT
GOWN STRL REUS W/TWL LRG LVL3 (GOWN DISPOSABLE) ×3 IMPLANT
HOLDER FOLEY CATH W/STRAP (MISCELLANEOUS) IMPLANT
I-SEED AGX100 ×1 IMPLANT
IMPL SPACEOAR VUE SYSTEM (Spacer) IMPLANT
IMPLANT SPACEOAR VUE SYSTEM (Spacer) ×3 IMPLANT
IV NS 1000ML (IV SOLUTION) ×3
IV NS 1000ML BAXH (IV SOLUTION) ×2 IMPLANT
KIT TURNOVER CYSTO (KITS) ×3 IMPLANT
MARKER SKIN DUAL TIP RULER LAB (MISCELLANEOUS) ×3 IMPLANT
PACK CYSTO (CUSTOM PROCEDURE TRAY) ×3 IMPLANT
SUT BONE WAX W31G (SUTURE) IMPLANT
SYR 10ML LL (SYRINGE) ×3 IMPLANT
TOWEL OR 17X26 10 PK STRL BLUE (TOWEL DISPOSABLE) ×3 IMPLANT
UNDERPAD 30X36 HEAVY ABSORB (UNDERPADS AND DIAPERS) ×6 IMPLANT
WATER STERILE IRR 500ML POUR (IV SOLUTION) ×3 IMPLANT

## 2020-05-26 NOTE — Op Note (Signed)
PATIENT:  Craig Peters  PRE-OPERATIVE DIAGNOSIS:  Adenocarcinoma of the prostate  POST-OPERATIVE DIAGNOSIS:  Same  PROCEDURE:  1. I-125 radioactive seed implantation 2. Cystoscopy  3. Placement of SpaceOAR  SURGEON:  Rexene Alberts, MD  Radiation oncologist: Tyler Pita, MD  ANESTHESIA:  General  EBL:  Minimal  DRAINS: None  INDICATION: Craig Peters  Description of procedure: After informed consent the patient was brought to the major OR, placed on the table and administered general anesthesia. He was then moved to the modified lithotomy position with his perineum perpendicular to the floor. His perineum and genitalia were then sterilely prepped. An official timeout was then performed. A 16 French Foley catheter was then placed in the bladder and filled with dilute contrast, a rectal tube was placed in the rectum and the transrectal ultrasound probe was placed in the rectum and affixed to the stand. He was then sterilely draped.  Real time ultrasonography was used along with the seed planning software. This was used to develop the seed plan including the number of needles as well as number of seeds required for complete and adequate coverage. Real-time ultrasonography was then used along with the previously developed plan and the Nucletron device to implant a total of 78 seeds using 20 needles. This proceeded without difficulty or complication.   I then proceeded with placement of SpaceOAR by introducing a needle with the bevel angled inferiorly approximately 2 cm superior to the anus. This was angled downward and under direct ultrasound was placed within the space between the prostatic capsule and rectum. This was confirmed with a small amount of sterile saline injected and this was performed under direct ultrasound. I then attached the SpaceOAR to the needle and injected this in the space between the prostate and rectum with good placement noted.  A Foley catheter was then  removed as well as the transrectal ultrasound probe and rectal probe. Flexible cystoscopy was then performed using the 16 French flexible scope which revealed a normal urethra throughout its length down to the sphincter which appeared intact. The prostatic urethra revealed bilobar hypertrophy but no evidence of obstruction, seeds, spacers or lesions. The bladder was then entered and fully and systematically inspected. The ureteral orifices were noted to be of normal configuration and position. The mucosa revealed no evidence of tumors. There were also no stones identified within the bladder. I noted no seeds or spacers on the floor of the bladder and retroflexion of the scope revealed no seeds protruding from the base of the prostate.  The cystoscope was then removed and the patient was awakened and taken to recovery room in stable and satisfactory condition. He tolerated procedure well and there were no intraoperative complications.  [facility]  Pager: [phone]

## 2020-05-26 NOTE — Anesthesia Postprocedure Evaluation (Signed)
Anesthesia Post Note  Patient: Craig Peters  Procedure(s) Performed: RADIOACTIVE SEED IMPLANT/BRACHYTHERAPY IMPLANT WITH CYSTOSCOPY (N/A Prostate) SPACE OAR INSTILLATION (N/A Perineum)     Patient location during evaluation: PACU Anesthesia Type: General Level of consciousness: awake and alert Pain management: pain level controlled Vital Signs Assessment: post-procedure vital signs reviewed and stable Respiratory status: spontaneous breathing, nonlabored ventilation and respiratory function stable Cardiovascular status: blood pressure returned to baseline and stable Postop Assessment: no apparent nausea or vomiting Anesthetic complications: no   No complications documented.  Last Vitals:  Vitals:   05/26/20 1446 05/26/20 1515  BP: (!) 146/96 (!) 143/104  Pulse: 65 66  Resp: 20 14  Temp:  36.4 C  SpO2: 95% 99%    Last Pain:  Vitals:   05/26/20 1515  TempSrc:   PainSc: 0-No pain                 Lidia Collum

## 2020-05-26 NOTE — Anesthesia Procedure Notes (Signed)
Procedure Name: LMA Insertion Date/Time: 05/26/2020 12:26 PM Performed by: Georgeanne Nim, CRNA Pre-anesthesia Checklist: Patient identified, Emergency Drugs available, Suction available, Patient being monitored and Timeout performed Patient Re-evaluated:Patient Re-evaluated prior to induction Oxygen Delivery Method: Circle system utilized Preoxygenation: Pre-oxygenation with 100% oxygen Induction Type: IV induction Ventilation: Mask ventilation without difficulty LMA: LMA inserted LMA Size: 4.0 Number of attempts: 1 Placement Confirmation: positive ETCO2,  CO2 detector and breath sounds checked- equal and bilateral Tube secured with: Tape Dental Injury: Teeth and Oropharynx as per pre-operative assessment

## 2020-05-26 NOTE — Progress Notes (Signed)
  Radiation Oncology         (336) [phone] ________________________________  Name: Craig Peters MRN: [id number]  Date: 05/26/2020  DOB: 21-Dec-1945       Prostate Seed Implant  NU:[id number], L.Marlou Sa, MD  No ref. provider found  DIAGNOSIS:   75 y.o. gentleman with Stage T1c adenocarcinoma of the prostate with Gleason score of 3+4, and PSA of 7.12    ICD-10-CM   1. Malignant neoplasm of prostate [facility])  [facility] Discharge patient    PROCEDURE: Insertion of radioactive I-125 seeds into the prostate gland.  RADIATION DOSE: 145 Gy, definitive therapy.  TECHNIQUE: [place] was brought to the operating room with the urologist. He was placed in the dorsolithotomy position. He was catheterized and a rectal tube was inserted. The perineum was shaved, prepped and draped. The ultrasound probe was then introduced into the rectum to see the prostate gland.  TREATMENT DEVICE: A needle grid was attached to the ultrasound probe stand and anchor needles were placed.  3D PLANNING: The prostate was imaged in 3D using a sagittal sweep of the prostate probe. These images were transferred to the planning computer. There, the prostate, urethra and rectum were defined on each axial reconstructed image. Then, the software created an optimized 3D plan and a few seed positions were adjusted. The quality of the plan was reviewed using [facility] information for the target and the following two organs at risk:  Urethra and Rectum.  Then the accepted plan was printed and handed off to the radiation therapist.  Under my supervision, the custom loading of the seeds and spacers was carried out and loaded into sealed vicryl sleeves.  These pre-loaded needles were then placed into the needle holder.Marland Kitchen  PROSTATE VOLUME STUDY:  Using transrectal ultrasound the volume of the prostate was verified to be 54.3 cc.  SPECIAL TREATMENT PROCEDURE/SUPERVISION AND HANDLING: The pre-loaded needles were then delivered under sagittal  guidance. A total of 20 needles were used to deposit 78 seeds in the prostate gland. The individual seed activity was 0.507 mCi.  SpaceOAR:  Yes  COMPLEX SIMULATION: At the end of the procedure, an anterior radiograph of the pelvis was obtained to document seed positioning and count. Cystoscopy was performed to check the urethra and bladder.  MICRODOSIMETRY: At the end of the procedure, the patient was emitting 0.013 mR/hr at 1 meter. Accordingly, he was considered safe for hospital discharge.  PLAN: The patient will return to the radiation oncology clinic for post implant CT dosimetry in three weeks.   ________________________________  Sheral Apley Tammi Klippel, M.D.

## 2020-05-26 NOTE — H&P (Signed)
Office Visit Report     05/19/2020   --------------------------------------------------------------------------------   Craig Peters  MRN: [id number]  DOB: [birth year], 75 year old Male  SSN:    PRIMARY CARE:  L Donnie Coffin, MD  REFERRING:  Donavan Burnet, MD  PROVIDER:  Link Snuffer, III, M.D.  TREATING:  Jiles Crocker, NP  LOCATION:  [facility] [phone]     --------------------------------------------------------------------------------   CC/HPI: Craig Peters is a 75 year old male seen in follow-up with prostate cancer.   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 TRUS volume measured 46 cc. He met with Dr. Tammi Klippel and has decided on brachytherapy.   He denies bone pain or expected weight loss. Stable appetite. He has no significant bothersome urinary tract symptoms. Denies gross hematuria or dysuria. He denies family history of prostate cancer. He denies prior UTI or prostatitis.   Patient currently denies fever, chills, sweats, nausea, vomiting, abdominal or flank pain, gross hematuria or dysuria.    03/18/2019: Patient scheduled undergoing brachytherapy seed implant on 01/21 with Dr. Abner Greenspan for treatment of prostate cancer. He is here today for pre-operative appointment. Since last office visit he denies any changes to past medical history. Currently not on any type of prescription medication. He denies interval surgical procedure. No interval fevers or chills, nausea/vomiting. No interval treatment for infection. He denies any new or worsening lower urinary tract symptoms. He endorses good force of stream and non bothersome, stable daytime frequency without significant urgency. He has occasional nocturia. No interval burning or painful urination, visible blood in the urine.   05/19/2020: He returns. His previously scheduled brachytherapy seed implant on  01/21 was delayed due to a change in pt's insurance and a transportation issue that arose. He is here today for pre-operative appointment. Since last office visit he denies any changes to past medical history. Currently not on any type of prescription medication. He denies interval surgical procedure. No interval fevers or chills, nausea/vomiting. No interval treatment for infection. He denies any new or worsening lower urinary tract symptoms. He endorses good force of stream and non bothersome, stable daytime frequency without significant urgency. He has occasional nocturia. No interval burning or painful urination, visible blood in the urine.     ALLERGIES: None   MEDICATIONS: None   GU PSH: Prostate Needle Biopsy - 11/13/2019     NON-GU PSH: Surgical Pathology, Gross And Microscopic Examination For Prostate Needle - 11/13/2019     GU PMH: BPH w/o LUTS - 03/17/2020, - 09/13/2019 Prostate Cancer - 03/17/2020, - 01/08/2020, - 12/21/2019, - 11/29/2019 Elevated PSA - 09/13/2019 Encounter for Prostate Cancer screening - 09/13/2019    NON-GU PMH: Hypercholesterolemia    FAMILY HISTORY: Colon Cancer - Grandmother father deceased - Father mother deceased - Mother   SOCIAL HISTORY: Marital Status: Single Ethnicity: Not Hispanic Or Latino; Race: White Current Smoking Status: Patient does not smoke anymore. Has not smoked since 09/13/2014. Smoked for 30 years.   Tobacco Use Assessment Completed: Used Tobacco in last 30 days? Drinks 5 drinks per month. Types of alcohol consumed: Beer.     REVIEW OF SYSTEMS:    GU Review Male:   Patient reports get up at night to urinate. Patient denies frequent urination, hard to postpone urination, burning/ pain with urination, leakage of urine, stream starts and stops, trouble starting your stream, have to strain to urinate , erection problems,  and penile pain.  Gastrointestinal (Upper):   Patient denies nausea, vomiting, and indigestion/ heartburn.  Gastrointestinal  (Lower):   Patient denies diarrhea and constipation.  Constitutional:   Patient denies fever, night sweats, weight loss, and fatigue.  Skin:   Patient denies itching and skin rash/ lesion.  Eyes:   Patient denies blurred vision and double vision.  Ears/ Nose/ Throat:   Patient denies sore throat and sinus problems.  Hematologic/Lymphatic:   Patient denies swollen glands and easy bruising.  Cardiovascular:   Patient denies leg swelling and chest pains.  Respiratory:   Patient denies cough and shortness of breath.  Endocrine:   Patient denies excessive thirst.  Musculoskeletal:   Patient denies back pain and joint pain.  Neurological:   Patient denies headaches and dizziness.  Psychologic:   Patient denies depression and anxiety.   Notes: Reviewed previous review of systems 04/04/07. No Changes.   VITAL SIGNS:      05/19/2020 08:49 AM 05/19/2020 08:32 AM  BP 148/88 mmHg 168/108 mmHg  Pulse   72 /min  Temperature   97.5 F / 36.3 C   MULTI-SYSTEM PHYSICAL EXAMINATION:    Constitutional: Well-nourished. No physical deformities. Normally developed. Good grooming.  Neck: Neck symmetrical, not swollen. Normal tracheal position.  Respiratory: No labored breathing, no use of accessory muscles.   Cardiovascular: Normal temperature, normal extremity pulses, no swelling, no varicosities.  Skin: No paleness, no jaundice, no cyanosis. No lesion, no ulcer, no rash.  Neurologic / Psychiatric: Oriented to time, oriented to place, oriented to person. No depression, no anxiety, no agitation.  Gastrointestinal: No mass, no tenderness, no rigidity, non obese abdomen.  Musculoskeletal: Normal gait and station of head and neck.     Complexity of Data:  Source Of History:  Patient, Medical Record Summary  Lab Test Review:   PSA  Records Review:   Pathology Reports, Previous Doctor Records, Previous Hospital Records, Previous Patient Records  Urine Test Review:   Urinalysis, Urine Culture   05/19/20   Urinalysis  Urine Appearance Clear   Urine Color Yellow   Urine Glucose Neg mg/dL  Urine Bilirubin Neg mg/dL  Urine Ketones Neg mg/dL  Urine Specific Gravity 1.015   Urine Blood Neg ery/uL  Urine pH 8.0   Urine Protein Trace mg/dL  Urine Urobilinogen 0.2 mg/dL  Urine Nitrites Neg   Urine Leukocyte Esterase Neg leu/uL   PROCEDURES:          Urinalysis Dipstick Dipstick Cont'd  Color: Yellow Bilirubin: Neg mg/dL  Appearance: Clear Ketones: Neg mg/dL  Specific Gravity: 1.015 Blood: Neg ery/uL  pH: 8.0 Protein: Trace mg/dL  Glucose: Neg mg/dL Urobilinogen: 0.2 mg/dL    Nitrites: Neg    Leukocyte Esterase: Neg leu/uL    ASSESSMENT:      ICD-10 Details  1 GU:   Prostate Cancer - C61 Chronic, Threat to Bodily Function   PLAN:           Schedule Return Visit/Planned Activity: Keep Scheduled Appointment - Schedule Surgery          Document Letter(s):  Created for Patient: Clinical Summary         Notes:   All questions answered to the best my ability about upcoming procedure and expected postoperative course with understanding expressed by the patient. Urine c/s was negative after our last OV. UA clear today and he is not having worsening LUTS. I'll defer repeat urine c/s today. Moving forward he will proceed with previously scheduled radioactive seed  implant for management of underlying prostate cancer diagnosis on 05/26/2020 with Dr Abner Greenspan.        Next Appointment:      Next Appointment: 05/26/2020 12:15 PM    Appointment Type: Surgery     Location: [facility] [phone]    Provider: Rexene Alberts, M.D.    Reason for Visit: NE/OP RAD SEED IMP AND SPACE OAR      Signed by Jiles Crocker, NP on 05/19/20 at 8:51 AM (EST)  Urology Preoperative H&P   Chief Complaint: Prostate cancer  History of Present Illness: Craig Peters is a 75 y.o. male with prostate cancer here for brachytherapy, TRUS, SpaceOAR. Reports doing well. No fevers, chills.   Past  Medical History:  Diagnosis Date  . History of adenomatous polyp of colon   . Prostate cancer [facility]) urologist-- dr Hiromi Knodel/  oncologist--- dr Tammi Klippel   dx 08/ 2021, Stage T1c, Gleason 3+4, PSA 7.12  . Wears contact lenses     Past Surgical History:  Procedure Laterality Date  . COLONOSCOPY  last one 2019  . NO PAST SURGERIES    . PROSTATE BIOPSY      Allergies: No Known Allergies  Family History  Problem Relation Age of Onset  . Colon cancer Paternal Grandmother   . Prostate cancer Neg Hx   . Pancreatic cancer Neg Hx   . Breast cancer Neg Hx     Social History:  reports that he quit smoking about 8 years ago. His smoking use included cigarettes. He quit after 50.00 years of use. He has never used smokeless tobacco. He reports current alcohol use. He reports that he does not use drugs.  ROS: A complete review of systems was performed.  All systems are negative except for pertinent findings as noted.  Physical Exam:  Vital signs in last 24 hours: Temp:  [97.5 F (36.4 C)] 97.5 F (36.4 C) (03/14 1038) Constitutional:  Alert and oriented, No acute distress Cardiovascular: Regular rate and rhythm Respiratory: Normal respiratory effort, Lungs clear bilaterally GI: Abdomen is soft, nontender, nondistended, no abdominal masses GU: No CVA tenderness Lymphatic: No lymphadenopathy Neurologic: Grossly intact, no focal deficits Psychiatric: Normal mood and affect  Laboratory Data:  No results for input(s): WBC, HGB, HCT, PLT in the last 72 hours.  No results for input(s): NA, K, CL, GLUCOSE, BUN, CALCIUM, CREATININE in the last 72 hours.  Invalid input(s): CO3   No results found for this or any previous visit (from the past 24 hour(s)). Recent Results (from the past 240 hour(s))  SARS CORONAVIRUS 2 (TAT 6-24 HRS) Nasopharyngeal Nasopharyngeal Swab     Status: None   Collection Time: 05/22/20 10:05 AM   Specimen: Nasopharyngeal Swab  Result Value Ref Range Status   SARS  Coronavirus 2 NEGATIVE NEGATIVE Final    Comment: (NOTE) SARS-CoV-2 target nucleic acids are NOT DETECTED.  The SARS-CoV-2 RNA is generally detectable in upper and lower respiratory specimens during the acute phase of infection. Negative results do not preclude SARS-CoV-2 infection, do not rule out co-infections with other pathogens, and should not be used as the sole basis for treatment or other patient management decisions. Negative results must be combined with clinical observations, patient history, and epidemiological information. The expected result is Negative.  Fact Sheet for Patients: SugarRoll.be  Fact Sheet for Healthcare Providers: https://www.woods-mathews.com/  This test is not yet approved or cleared by the [place] FDA and  has been authorized for detection and/or diagnosis of SARS-CoV-2  by FDA under an Emergency Use Authorization (EUA). This EUA will remain  in effect (meaning this test can be used) for the duration of the COVID-19 declaration under Se ction 564(b)(1) of the Act, 21 U.S.C. section 360bbb-3(b)(1), unless the authorization is terminated or revoked sooner.  Performed at [facility], [place] [address], [place], [place] [address]     Renal Function: Recent Labs    05/22/20 7341  CREATININE 0.84   Estimated Creatinine Clearance: 86 mL/min (by C-G formula based on SCr of 0.84 mg/dL).  Radiologic Imaging: No results found.  I independently reviewed the above imaging studies.  Assessment and Plan Craig Peters is a 75 y.o. male with prostate cancer here for brachytherapy, TRUS, SpaceOAR.   Brachytherapy/space OAR consent- The patient was counseled about the natural history of prostate cancer and the standard treatment options that are available for prostate cancer. It was explained to him how his age and life expectancy, clinical stage, Gleason score, and PSA affect his prognosis, the  decision to proceed with additional staging studies, as well as how that information influences recommended treatment strategies. We discussed the roles for active surveillance, radiation therapy, surgical therapy, androgen deprivation, as well as ablative therapy options for the treatment of prostate cancer as appropriate to his individual cancer situation. We discussed the risks and benefits of these options with regard to their impact on cancer control and also in terms of potential adverse events, complications, and impact on quality of life particularly related to urinary and sexual function. The patient was encouraged to ask questions throughout the discussion today and all questions were answered to his stated satisfaction. In addition, the patient was provided with and/or directed to appropriate resources and literature for further education about prostate cancer and treatment options.   The patient has decided to proceed with brachytherapy and SpaceOAR placement as primary treatment of his intermediate risk prostate cancer.  The risks, benefits and alternatives of the aforementioned procedures was discussed in detail.  Risks include, bur are not limited to worsening LUTS, erectile dysfunction, rectal irritation, urethral stricture formation, fistula formation, cancer recurrence, MI, CVA, PE, DVT and the inherent risk of general anesthesia.  He voices understanding and wishes to proceed.   Matt R. Ayannah Faddis MD 05/26/2020, 10:42 AM  [facility] Pager: [phone]): [phone]

## 2020-05-26 NOTE — Discharge Instructions (Signed)
   Activity:  You are encouraged to ambulate frequently (about every hour during waking hours) to help prevent blood clots from forming in your legs or lungs.     Diet: You should advance your diet as instructed by your physician.  It will be normal to have some bloating, nausea, and abdominal discomfort intermittently.   Prescriptions:  You will be provided a prescription for pain medication to take as needed.  If your pain is not severe enough to require the prescription pain medication, you may take extra strength Tylenol instead which will have less side effects.  You should also take a prescribed stool softener to avoid straining with bowel movements as the prescription pain medication may constipate you.   What to call us about: You should call the office [phone]) if you develop fever > 101 or develop persistent vomiting. Activity:  You are encouraged to ambulate frequently (about every hour during waking hours) to help prevent blood clots from forming in your legs or lungs.    Radioactive Seed Implant Home Care Instructions   Activity:    Rest for the remainder of the day.  Do not drive or operate equipment today.  You may resume normal  activities in a few days as instructed by your physician, without risk of harmful radiation exposure to those around you, provided you follow the time and distance precautions on the Radiation Oncology Instruction Sheet.   Meals: Drink plenty of lipuids and eat light foods, such as gelatin or soup this evening .  You may return to normal meal plan tomorrow.  Return To Work: You may return to work as instructed by Naval [employer].  Special Instruction:   If any seeds are found, use tweezers to pick up seeds and place in a glass container of any kind and bring to your physician's office.  Call your physician if any of these symptoms occur:   Persistent or heavy bleeding  Urine stream diminishes or stops completely after catheter is  removed  Fever equal to or greater than 101 degrees F  Cloudy urine with a strong foul odor  Severe pain  You may feel some burning pain and/or hesitancy when you urinate after the catheter is removed.  These symptoms may increase over the next few weeks, but should diminish within forur to six weeks.  Applying moist heat to the lower abdomen or a hot tub bath may help relieve the pain.  If the discomfort becomes severe, please call your physician for additional medications.   Post Anesthesia Home Care Instructions  Activity: Get plenty of rest for the remainder of the day. A responsible individual must stay with you for 24 hours following the procedure.  For the next 24 hours, DO NOT: -Drive a car -Paediatric nurse -Drink alcoholic beverages -Take any medication unless instructed by your physician -Make any legal decisions or sign important papers.  Meals: Start with liquid foods such as gelatin or soup. Progress to regular foods as tolerated. Avoid greasy, spicy, heavy foods. If nausea and/or vomiting occur, drink only clear liquids until the nausea and/or vomiting subsides. Call your physician if vomiting continues.  Special Instructions/Symptoms: Your throat may feel dry or sore from the anesthesia or the breathing tube placed in your throat during surgery. If this causes discomfort, gargle with warm salt water. The discomfort should disappear within 24 hours.

## 2020-05-26 NOTE — Transfer of Care (Signed)
Immediate Anesthesia Transfer of Care Note  Patient: Craig Peters  Procedure(s) Performed: RADIOACTIVE SEED IMPLANT/BRACHYTHERAPY IMPLANT WITH CYSTOSCOPY (N/A Prostate) SPACE OAR INSTILLATION (N/A Perineum)  Patient Location: PACU  Anesthesia Type:General  Level of Consciousness: drowsy and patient cooperative  Airway & Oxygen Therapy: Patient Spontanous Breathing and Patient connected to nasal cannula oxygen  Post-op Assessment: Report given to RN and Post -op Vital signs reviewed and stable  Post vital signs: Reviewed and stable  Last Vitals:  Vitals Value Taken Time  BP    Temp    Pulse 56 05/26/20 1348  Resp 13 05/26/20 1348  SpO2 98 % 05/26/20 1348  Vitals shown include unvalidated device data.  Last Pain:  Vitals:   05/26/20 1113  TempSrc: Oral  PainSc:       Patients Stated Pain Goal: (P) 7 (98/10/25 4862)  Complications: No complications documented.

## 2020-05-26 NOTE — Anesthesia Preprocedure Evaluation (Signed)
Anesthesia Evaluation  Patient identified by MRN, date of birth, ID band Patient awake    Reviewed: Allergy & Precautions, NPO status , Patient's Chart, lab work & pertinent test results  History of Anesthesia Complications Negative for: history of anesthetic complications  Airway Mallampati: II  TM Distance: >3 FB Neck ROM: Full    Dental  (+) Teeth Intact   Pulmonary neg pulmonary ROS, former smoker,    Pulmonary exam normal        Cardiovascular negative cardio ROS Normal cardiovascular exam     Neuro/Psych negative neurological ROS  negative psych ROS   GI/Hepatic negative GI ROS, Neg liver ROS,   Endo/Other  negative endocrine ROS  Renal/GU negative Renal ROS   Prostate cancer    Musculoskeletal negative musculoskeletal ROS (+)   Abdominal   Peds  Hematology negative hematology ROS (+)   Anesthesia Other Findings   Reproductive/Obstetrics                            Anesthesia Physical Anesthesia Plan  ASA: II  Anesthesia Plan: General   Post-op Pain Management:    Induction: Intravenous  PONV Risk Score and Plan: 2 and Ondansetron, Dexamethasone, Midazolam and Treatment may vary due to age or medical condition  Airway Management Planned: LMA  Additional Equipment: None  Intra-op Plan:   Post-operative Plan: Extubation in OR  Informed Consent: I have reviewed the patients History and Physical, chart, labs and discussed the procedure including the risks, benefits and alternatives for the proposed anesthesia with the patient or authorized representative who has indicated his/her understanding and acceptance.     Dental advisory given  Plan Discussed with:   Anesthesia Plan Comments:         Anesthesia Quick Evaluation

## 2020-05-27 ENCOUNTER — Encounter (HOSPITAL_BASED_OUTPATIENT_CLINIC_OR_DEPARTMENT_OTHER): Payer: Self-pay | Admitting: Urology

## 2020-07-01 ENCOUNTER — Telehealth: Payer: Self-pay | Admitting: *Deleted

## 2020-07-01 NOTE — Telephone Encounter (Signed)
Called patient to remind of post seed appts. For 07-02-20 lvm for a return call

## 2020-07-01 NOTE — Progress Notes (Signed)
Radiation Oncology         [phone] ________________________________  Name: Craig Peters MRN: [id number]  Date: 07/02/2020  DOB: November 23, 1945  Post-Seed Follow-Up Visit Note  CC: Alroy Dust, L.Marlou Sa, MD  Lucas Mallow, MD  Diagnosis:   75 y.o. gentleman with Stage T1c adenocarcinoma of the prostate with Gleason score of 3+4, and PSA of 7.12.    ICD-10-CM   1. Malignant neoplasm of prostate (HCC)  C61     Interval Since Last Radiation:  5 weeks 05/26/20:  Insertion of radioactive I-125 seeds into the prostate gland; 145 Gy, definitive therapy with placement of SpaceOAR gel.  Narrative:  The patient returns today for routine follow-up.  He is complaining of increased urinary frequency and urinary hesitation symptoms. He filled out a questionnaire regarding urinary function today providing and overall IPSS score of 11 characterizing his symptoms as moderate with weak stream, hesitancy at start of stream and nocturia x1/night.  He reports only occasional, mild residual dysuria at the start of his stream but denies gross hematuria or malodorous urine. Urinary frequency and urgency are improving and tolerable at this point.  His pre-implant score was 3. He denies abdominal pain but has continued with some increased pressure with defecation as well as alternating between constipation and loose stools.  Bowel issues are gradually improving as is the fatigue and overall, he is quite pleased with his progress to date.  ALLERGIES:  has No Known Allergies.  Meds: Current Outpatient Medications  Medication Sig Dispense Refill  . docusate sodium (COLACE) 100 MG capsule Take 1 capsule (100 mg total) by mouth daily as needed for up to 30 doses. 30 capsule 0  . oxyCODONE-acetaminophen (PERCOCET) 5-325 MG tablet Take 1 tablet by mouth every 4 (four) hours as needed for up to 18 doses for severe pain. 18 tablet 0   No current facility-administered medications for this visit.    Physical  Findings: In general this is a well appearing Caucasian male in no acute distress. He's alert and oriented x4 and appropriate throughout the examination. Cardiopulmonary assessment is negative for acute distress and he exhibits normal effort.   Lab Findings: Lab Results  Component Value Date   WBC 5.6 05/22/2020   HGB 15.1 05/22/2020   HCT 44.7 05/22/2020   MCV 92.9 05/22/2020   PLT 288 05/22/2020    Radiographic Findings:  Patient underwent CT imaging in our clinic for post implant dosimetry. The CT will be reviewed by Dr. Tammi Klippel to confirm there is an adequate distribution of radioactive seeds throughout the prostate gland and ensure that there are no seeds in or near the rectum. We suspect the final radiation plan and dosimetry will show appropriate coverage of the prostate gland. He understands that we will call and inform him of any unexpected findings on further review of his imaging and dosimetry.  Impression/Plan: 75 y.o. gentleman with Stage T1c adenocarcinoma of the prostate with Gleason score of 3+4, and PSA of 7.12. The patient is recovering from the effects of radiation. His urinary symptoms should gradually improve over the next 4-6 months. We talked about this today. He is encouraged by his improvement already and is otherwise pleased with his outcome. We also talked about long-term follow-up for prostate cancer following seed implant. He understands that ongoing PSA determinations and digital rectal exams will help perform surveillance to rule out disease recurrence. He has a follow up appointment scheduled with Dr. Abner Greenspan on 07/03/20. He understands what to expect with  his PSA measures. Patient was also educated today about some of the long-term effects from radiation including a small risk for rectal bleeding and possibly erectile dysfunction. We talked about some of the general management approaches to these potential complications. However, I did encourage the patient to contact our  office or return at any point if he has questions or concerns related to his previous radiation and prostate cancer.    Nicholos Johns, PA-C

## 2020-07-01 NOTE — Progress Notes (Signed)
  Radiation Oncology         [phone]) [phone] ________________________________  Name: Craig Peters MRN: [id number]  Date: 07/02/2020  DOB: [birth year]  COMPLEX SIMULATION NOTE  NARRATIVE:  The patient was brought to the [facility] today following prostate seed implantation approximately one month ago.  Identity was confirmed.  All relevant records and images related to the planned course of therapy were reviewed.  Then, the patient was set-up supine.  CT images were obtained.  The CT images were loaded into the planning software.  Then the prostate and rectum were contoured.  Treatment planning then occurred.  The implanted iodine 125 seeds were identified by the physics staff for projection of radiation distribution  I have requested : 3D Simulation  I have requested a DVH of the following structures: Prostate and rectum.    ________________________________  Sheral Apley Tammi Klippel, M.D.

## 2020-07-02 ENCOUNTER — Encounter: Payer: Self-pay | Admitting: Urology

## 2020-07-02 ENCOUNTER — Ambulatory Visit
Admission: RE | Admit: 2020-07-02 | Discharge: 2020-07-02 | Disposition: A | Discharge status: Home / Self Care | Payer: Medicare Other | Source: Ambulatory Visit | Attending: Radiation Oncology | Admitting: Radiation Oncology

## 2020-07-02 ENCOUNTER — Ambulatory Visit
Admission: RE | Admit: 2020-07-02 | Discharge: 2020-07-02 | Disposition: A | Discharge status: Home / Self Care | Payer: Medicare Other | Source: Ambulatory Visit | Attending: Urology | Admitting: Urology

## 2020-07-02 ENCOUNTER — Other Ambulatory Visit: Payer: Self-pay

## 2020-07-02 VITALS — BP 144/90 | HR 101 | Temp 97.6°F | Resp 18 | Wt 169.4 lb

## 2020-07-02 VITALS — BP 144/90 | HR 101 | Temp 97.6°F | Resp 18 | Ht 72.0 in | Wt 169.4 lb

## 2020-07-02 DIAGNOSIS — Z923 Personal history of irradiation: Secondary | ICD-10-CM | POA: Diagnosis not present

## 2020-07-02 DIAGNOSIS — C61 Malignant neoplasm of prostate: Secondary | ICD-10-CM | POA: Diagnosis not present

## 2020-07-03 ENCOUNTER — Encounter: Payer: Self-pay | Admitting: Medical Oncology

## 2020-07-15 ENCOUNTER — Encounter: Payer: Self-pay | Admitting: Radiation Oncology

## 2020-07-15 ENCOUNTER — Ambulatory Visit
Admission: RE | Admit: 2020-07-15 | Discharge: 2020-07-15 | Disposition: A | Discharge status: Home / Self Care | Payer: Medicare Other | Source: Ambulatory Visit | Attending: Radiation Oncology | Admitting: Radiation Oncology

## 2020-07-15 DIAGNOSIS — C61 Malignant neoplasm of prostate: Secondary | ICD-10-CM | POA: Diagnosis present

## 2020-07-21 NOTE — Progress Notes (Signed)
  Radiation Oncology         [phone]) [phone] ________________________________  Name: Craig Peters MRN: [id number]  Date: 07/15/2020  DOB: February 09, 1946  3D Planning Note   Prostate Brachytherapy Post-Implant Dosimetry  Diagnosis: 75 y.o. gentleman with Stage T1c adenocarcinoma of the prostate with Gleason score of 3+4, and PSA of 7.12.  Narrative: On a previous date, Craig Peters returned following prostate seed implantation for post implant planning. He underwent CT scan complex simulation to delineate the three-dimensional structures of the pelvis and demonstrate the radiation distribution.  Since that time, the seed localization, and complex isodose planning with dose volume histograms have now been completed.  Results:   Prostate Coverage - The dose of radiation delivered to the 90% or more of the prostate gland (D90) was 110.8% of the prescription dose. This exceeds our goal of greater than 90%. Rectal Sparing - The volume of rectal tissue receiving the prescription dose or higher was 0.0 cc. This falls under our thresholds tolerance of 1.0 cc.  Impression: The prostate seed implant appears to show adequate target coverage and appropriate rectal sparing.  Plan:  The patient will continue to follow with urology for ongoing PSA determinations. I would anticipate a high likelihood for local tumor control with minimal risk for rectal morbidity.  ________________________________  Sheral Apley Tammi Klippel, M.D.

## 2020-07-28 NOTE — Progress Notes (Signed)
Opened in error

## 2020-08-15 ENCOUNTER — Telehealth: Payer: Self-pay | Admitting: *Deleted

## 2020-08-15 NOTE — Telephone Encounter (Signed)
RETURNED PATIENT'S PHONE CALL, SPOKE WITH PATIENT. ?

## 2020-08-26 DIAGNOSIS — R3912 Poor urinary stream: Secondary | ICD-10-CM | POA: Diagnosis not present

## 2020-08-26 DIAGNOSIS — R3915 Urgency of urination: Secondary | ICD-10-CM | POA: Diagnosis not present

## 2020-08-26 DIAGNOSIS — R350 Frequency of micturition: Secondary | ICD-10-CM | POA: Diagnosis not present

## 2020-08-27 DIAGNOSIS — Z13220 Encounter for screening for lipoid disorders: Secondary | ICD-10-CM | POA: Diagnosis not present

## 2020-08-27 DIAGNOSIS — Z0000 Encounter for general adult medical examination without abnormal findings: Secondary | ICD-10-CM | POA: Diagnosis not present

## 2020-08-27 DIAGNOSIS — Z1159 Encounter for screening for other viral diseases: Secondary | ICD-10-CM | POA: Diagnosis not present

## 2020-08-27 DIAGNOSIS — Z136 Encounter for screening for cardiovascular disorders: Secondary | ICD-10-CM | POA: Diagnosis not present

## 2020-09-05 DIAGNOSIS — H25043 Posterior subcapsular polar age-related cataract, bilateral: Secondary | ICD-10-CM | POA: Diagnosis not present

## 2020-09-05 DIAGNOSIS — H43813 Vitreous degeneration, bilateral: Secondary | ICD-10-CM | POA: Diagnosis not present

## 2020-09-05 DIAGNOSIS — H35372 Puckering of macula, left eye: Secondary | ICD-10-CM | POA: Diagnosis not present

## 2020-09-05 DIAGNOSIS — H5212 Myopia, left eye: Secondary | ICD-10-CM | POA: Diagnosis not present

## 2020-09-05 DIAGNOSIS — H524 Presbyopia: Secondary | ICD-10-CM | POA: Diagnosis not present

## 2020-11-07 DIAGNOSIS — Z20822 Contact with and (suspected) exposure to covid-19: Secondary | ICD-10-CM | POA: Diagnosis not present

## 2020-12-04 DIAGNOSIS — R3915 Urgency of urination: Secondary | ICD-10-CM | POA: Diagnosis not present

## 2020-12-04 DIAGNOSIS — R3912 Poor urinary stream: Secondary | ICD-10-CM | POA: Diagnosis not present

## 2020-12-04 DIAGNOSIS — R350 Frequency of micturition: Secondary | ICD-10-CM | POA: Diagnosis not present

## 2021-05-25 DIAGNOSIS — C61 Malignant neoplasm of prostate: Secondary | ICD-10-CM | POA: Diagnosis not present

## 2021-05-25 DIAGNOSIS — R3915 Urgency of urination: Secondary | ICD-10-CM | POA: Diagnosis not present

## 2021-05-25 DIAGNOSIS — R350 Frequency of micturition: Secondary | ICD-10-CM | POA: Diagnosis not present

## 2021-08-04 DIAGNOSIS — M9903 Segmental and somatic dysfunction of lumbar region: Secondary | ICD-10-CM | POA: Diagnosis not present

## 2021-08-04 DIAGNOSIS — M62830 Muscle spasm of back: Secondary | ICD-10-CM | POA: Diagnosis not present

## 2021-08-04 DIAGNOSIS — M9904 Segmental and somatic dysfunction of sacral region: Secondary | ICD-10-CM | POA: Diagnosis not present

## 2021-08-04 DIAGNOSIS — M9905 Segmental and somatic dysfunction of pelvic region: Secondary | ICD-10-CM | POA: Diagnosis not present

## 2021-08-06 DIAGNOSIS — M62830 Muscle spasm of back: Secondary | ICD-10-CM | POA: Diagnosis not present

## 2021-08-06 DIAGNOSIS — M9905 Segmental and somatic dysfunction of pelvic region: Secondary | ICD-10-CM | POA: Diagnosis not present

## 2021-08-06 DIAGNOSIS — M9903 Segmental and somatic dysfunction of lumbar region: Secondary | ICD-10-CM | POA: Diagnosis not present

## 2021-08-06 DIAGNOSIS — M9904 Segmental and somatic dysfunction of sacral region: Secondary | ICD-10-CM | POA: Diagnosis not present

## 2021-08-11 DIAGNOSIS — M9903 Segmental and somatic dysfunction of lumbar region: Secondary | ICD-10-CM | POA: Diagnosis not present

## 2021-08-11 DIAGNOSIS — M9905 Segmental and somatic dysfunction of pelvic region: Secondary | ICD-10-CM | POA: Diagnosis not present

## 2021-08-11 DIAGNOSIS — M62830 Muscle spasm of back: Secondary | ICD-10-CM | POA: Diagnosis not present

## 2021-08-11 DIAGNOSIS — M9904 Segmental and somatic dysfunction of sacral region: Secondary | ICD-10-CM | POA: Diagnosis not present

## 2021-08-13 DIAGNOSIS — M62830 Muscle spasm of back: Secondary | ICD-10-CM | POA: Diagnosis not present

## 2021-08-13 DIAGNOSIS — M9905 Segmental and somatic dysfunction of pelvic region: Secondary | ICD-10-CM | POA: Diagnosis not present

## 2021-08-13 DIAGNOSIS — M9903 Segmental and somatic dysfunction of lumbar region: Secondary | ICD-10-CM | POA: Diagnosis not present

## 2021-08-13 DIAGNOSIS — M9904 Segmental and somatic dysfunction of sacral region: Secondary | ICD-10-CM | POA: Diagnosis not present

## 2021-09-04 DIAGNOSIS — E7800 Pure hypercholesterolemia, unspecified: Secondary | ICD-10-CM | POA: Diagnosis not present

## 2021-09-04 DIAGNOSIS — Z0000 Encounter for general adult medical examination without abnormal findings: Secondary | ICD-10-CM | POA: Diagnosis not present

## 2021-09-22 DIAGNOSIS — D123 Benign neoplasm of transverse colon: Secondary | ICD-10-CM | POA: Diagnosis not present

## 2021-09-22 DIAGNOSIS — Z09 Encounter for follow-up examination after completed treatment for conditions other than malignant neoplasm: Secondary | ICD-10-CM | POA: Diagnosis not present

## 2021-09-22 DIAGNOSIS — K5730 Diverticulosis of large intestine without perforation or abscess without bleeding: Secondary | ICD-10-CM | POA: Diagnosis not present

## 2021-09-22 DIAGNOSIS — Z86010 Personal history of colonic polyps: Secondary | ICD-10-CM | POA: Diagnosis not present

## 2021-09-24 DIAGNOSIS — D123 Benign neoplasm of transverse colon: Secondary | ICD-10-CM | POA: Diagnosis not present

## 2021-11-20 DIAGNOSIS — M9904 Segmental and somatic dysfunction of sacral region: Secondary | ICD-10-CM | POA: Diagnosis not present

## 2021-11-20 DIAGNOSIS — M62830 Muscle spasm of back: Secondary | ICD-10-CM | POA: Diagnosis not present

## 2021-11-20 DIAGNOSIS — M9903 Segmental and somatic dysfunction of lumbar region: Secondary | ICD-10-CM | POA: Diagnosis not present

## 2021-11-20 DIAGNOSIS — M9905 Segmental and somatic dysfunction of pelvic region: Secondary | ICD-10-CM | POA: Diagnosis not present

## 2021-11-24 DIAGNOSIS — C61 Malignant neoplasm of prostate: Secondary | ICD-10-CM | POA: Diagnosis not present

## 2021-11-26 DIAGNOSIS — H43813 Vitreous degeneration, bilateral: Secondary | ICD-10-CM | POA: Diagnosis not present

## 2021-11-26 DIAGNOSIS — H25013 Cortical age-related cataract, bilateral: Secondary | ICD-10-CM | POA: Diagnosis not present

## 2021-11-26 DIAGNOSIS — H18053 Posterior corneal pigmentations, bilateral: Secondary | ICD-10-CM | POA: Diagnosis not present

## 2021-11-26 DIAGNOSIS — H35372 Puckering of macula, left eye: Secondary | ICD-10-CM | POA: Diagnosis not present

## 2021-12-01 DIAGNOSIS — N400 Enlarged prostate without lower urinary tract symptoms: Secondary | ICD-10-CM | POA: Diagnosis not present

## 2021-12-01 DIAGNOSIS — C61 Malignant neoplasm of prostate: Secondary | ICD-10-CM | POA: Diagnosis not present

## 2022-02-12 ENCOUNTER — Encounter: Payer: Self-pay | Admitting: Podiatry

## 2022-02-12 ENCOUNTER — Ambulatory Visit: Payer: PPO | Admitting: Podiatry

## 2022-02-12 ENCOUNTER — Ambulatory Visit (INDEPENDENT_AMBULATORY_CARE_PROVIDER_SITE_OTHER): Payer: PPO

## 2022-02-12 DIAGNOSIS — M778 Other enthesopathies, not elsewhere classified: Secondary | ICD-10-CM

## 2022-02-12 DIAGNOSIS — I739 Peripheral vascular disease, unspecified: Secondary | ICD-10-CM

## 2022-02-12 DIAGNOSIS — G5762 Lesion of plantar nerve, left lower limb: Secondary | ICD-10-CM | POA: Diagnosis not present

## 2022-02-12 MED ORDER — MELOXICAM 15 MG PO TABS: 15.0000 mg | ORAL_TABLET | Freq: Every day | ORAL | 0 refills | Status: DC | PRN

## 2022-02-12 NOTE — Patient Instructions (Signed)
Morton Neuralgia  Morton neuralgia is foot pain that affects the ball of the foot and the area near the toes. Morton neuralgia occurs when part of a nerve in the foot (digital nerve) is under too much pressure (compressed). When this happens over a long period of time, the nerve can thicken (neuroma) and cause pain. Pain usually occurs between the third and fourth toes.  Morton neuralgia can come and go but may get worse over time. What are the causes? This condition is caused by doing the same things over and over with your foot, such as: Activities such as running or jumping. Wearing shoes that are too tight. What increases the risk? You may be at higher risk for Morton neuralgia if you: Are male. Wear high heels. Wear shoes that are narrow or tight. Do activities that repeatedly stretch your toes, such as: Running. Ballet. Long-distance walking. What are the signs or symptoms? The first symptom of Morton neuralgia is pain that spreads from the ball of the foot to the toes. It may feel like you are walking on a marble. Pain usually gets worse with walking and goes away at night. Other symptoms may include numbness and cramping of your toes. Both feet are equally affected, but rarely at the same time. How is this diagnosed? This condition is diagnosed based on your symptoms, your medical history, and a physical exam. Your health care provider may: Squeeze your foot just behind your toe. Ask you to move your toes to check for pain. Ask about your physical activity level. You also may have imaging tests, such as an X-ray, ultrasound, or MRI. How is this treated? Treatment depends on how severe your condition is and what causes it. Treatment may involve: Wearing different shoes that are not too tight, are low-heeled, and provide good support. For some people, this is the only treatment needed. Wearing an over-the-counter or custom supportive pad (orthotic) under the front of your  foot. Getting injections of numbing medicine and anti-inflammatory medicine (steroid) in the nerve. Having surgery to remove part of the thickened nerve. Follow these instructions at home: Managing pain, stiffness, and swelling  Massage your foot as needed. Wear orthotics as told by your health care provider. If directed, put ice on the painful area. To do this: Put ice in a plastic bag. Place a towel between your skin and the bag. Leave the ice on for 20 minutes, 2-3 times a day. Remove the ice if your skin turns bright red. This is very important. If you cannot feel pain, heat, or cold, you have a greater risk of damage to the area. Raise (elevate) the injured area above the level of your heart while you are sitting or lying down. Avoid activities that cause pain or make pain worse. If you play sports, ask your health care provider when it is safe for you to return to sports. General instructions Take over-the-counter and prescription medicines only as told by your health care provider. For the time period you were told by your health care provider, do not drive or use machinery. Wear shoes that: Have soft soles. Have a wide toe area. Provide arch support. Do not pinch or squeeze your feet. Have room for your orthotics, if this applies. Keep all follow-up visits. This is important. Contact a health care provider if: Your symptoms get worse or do not get better with treatment and home care. Summary Morton neuralgia is foot pain that affects the ball of the foot and the  area near the toes. Pain usually occurs between the third and fourth toes, gets worse with walking, and goes away at night. Morton neuralgia occurs when part of a nerve in the foot (digital nerve) is under too much pressure. When this happens over a long period of time, the nerve can thicken (neuroma) and cause pain. This condition is caused by doing the same things over and over with your foot, such as running or  jumping, wearing shoes that are too tight, or wearing high heels. Treatment may involve wearing low-heeled shoes that are not too tight, wearing a supportive pad (orthotic) under the front of your foot, getting injections in the nerve, or having surgery to remove part of the thickened nerve. This information is not intended to replace advice given to you by your health care provider. Make sure you discuss any questions you have with your health care provider. Document Revised: 08/08/2020 Document Reviewed: 08/08/2020 Elsevier Patient Education  [place].   Meloxicam Tablets What is this medication? MELOXICAM (mel OX i cam) treats mild to moderate pain, inflammation, or arthritis. It works by decreasing inflammation. It belongs to a group of medications called NSAIDs. This medicine may be used for other purposes; ask your health care provider or pharmacist if you have questions. COMMON BRAND NAME(S): Mobic What should I tell my care team before I take this medication? They need to know if you have any of these conditions: Asthma (lung or breathing disease) Bleeding disorder Coronary artery bypass graft (CABG) within the past 2 weeks Dehydration Frequently drink alcohol Heart attack Heart disease Heart failure High blood pressure Kidney disease Liver disease Stomach bleeding Stomach ulcers, other stomach or intestine problems Take medications that treat or prevent blood clots Taking other steroids, such as dexamethasone or prednisone Tobacco use An unusual or allergic reaction to meloxicam, other medications, foods, dyes, or preservatives Pregnant or trying to get pregnant Breast-feeding How should I use this medication? Take this medication by mouth. Take it as directed on the prescription label at the same time every day. You can take it with or without food. If it upsets your stomach, take it with food. Do not use it more often than directed. There may be unused or extra  doses in the bottle after you finish your treatment. Talk to your care team if you have questions about your dose. A special MedGuide will be given to you by the pharmacist with each prescription and refill. Be sure to read this information carefully each time. Talk to your care team about the use of this medication in children. Special care may be needed. People over 72 years of age may have a stronger reaction and need a smaller dose. Overdosage: If you think you have taken too much of this medicine contact a poison control center or emergency room at once. NOTE: This medicine is only for you. Do not share this medicine with others. What if I miss a dose? If you miss a dose, take it as soon as you can. If it is almost time for your next dose, take only that dose. Do not take double or extra doses. What may interact with this medication? Do not take this medication with any of the following: Cidofovir Ketorolac This medication may also interact with the following: Alcohol Aspirin and aspirin-like medications Certain medications for blood pressure, heart disease, irregular heart beat Certain medications for mental health conditions Certain medications that treat or prevent blood clots, such as warfarin, enoxaparin, dalteparin,  apixaban, dabigatran, rivaroxaban Cyclosporine Diuretics Fluconazole Lithium Methotrexate Other NSAIDs, medications for pain and inflammation, such as ibuprofen and naproxen Pemetrexed This list may not describe all possible interactions. Give your health care provider a list of all the medicines, herbs, non-prescription drugs, or dietary supplements you use. Also tell them if you smoke, drink alcohol, or use illegal drugs. Some items may interact with your medicine. What should I watch for while using this medication? Visit your care team for regular checks on your progress. Tell your care team if your symptoms do not start to get better or if they get worse. Do  not take other medications that contain aspirin, ibuprofen, or naproxen with this medication. Side effects such as stomach upset, nausea, or ulcers may be more likely to occur. Many non-prescription medications contain aspirin, ibuprofen, or naproxen. Always read labels carefully. This medication can cause serious ulcers and bleeding in the stomach. It can happen with no warning. Tobacco, alcohol, older age, and poor health can also increase risks. Call your care team right away if you have stomach pain or blood in your vomit or stool. This medication does not prevent a heart attack or stroke. This medication may increase the chance of a heart attack or stroke. The chance may increase the longer you use this medication or if you have heart disease. If you take aspirin to prevent a heart attack or stroke, talk to your care team about using this medication. This medication may cause serious skin reactions. They can happen weeks to months after starting the medication. Contact your care team right away if you notice fevers or flu-like symptoms with a rash. The rash may be red or purple and then turn into blisters or peeling of the skin. Or, you might notice a red rash with swelling of the face, lips or lymph nodes in your neck or under your arms. Talk to your care team if you wish to become pregnant or think you might be pregnant. This medication can cause serious birth defects. This medication may affect your coordination, reaction time, or judgment. Do not drive or operate machinery until you know how this medication affects you. Sit up or stand slowly to reduce the risk of dizzy or fainting spells. Drinking alcohol with this medication can increase the risk of these side effects. Be careful brushing or flossing your teeth or using a toothpick because you may get an infection or bleed more easily. If you have any dental work done, tell your dentist you are receiving this medication. This medication may make it  more difficult to get pregnant. Talk to your care team if you are concerned about your fertility. What side effects may I notice from receiving this medication? Side effects that you should report to your care team as soon as possible: Allergic reactions--skin rash, itching, hives, swelling of the face, lips, tongue, or throat Bleeding--bloody or black, tar-like stools, vomiting blood or brown material that looks like coffee grounds, red or dark brown urine, small red or purple spots on skin, unusual bruising or bleeding Heart attack--pain or tightness in the chest, shoulders, arms, or jaw, nausea, shortness of breath, cold or clammy skin, feeling faint or lightheaded Heart failure--shortness of breath, swelling of ankles, feet, or hands, sudden weight gain, unusual weakness or fatigue Increase in blood pressure Kidney injury--decrease in the amount of urine, swelling of the ankles, hands, or feet Liver injury--right upper belly pain, loss of appetite, nausea, light-colored stool, dark yellow or brown urine, yellowing  skin or eyes, unusual weakness or fatigue Rash, fever, and swollen lymph nodes Redness, blistering, peeling, or loosening of the skin, including inside the mouth Stroke--sudden numbness or weakness of the face, arm, or leg, trouble speaking, confusion, trouble walking, loss of balance or coordination, dizziness, severe headache, change in vision Side effects that usually do not require medical attention (report to your care team if they continue or are bothersome): Diarrhea Nausea Upset stomach This list may not describe all possible side effects. Call your doctor for medical advice about side effects. You may report side effects to FDA at 1-800-FDA-1088. Where should I keep my medication? Keep out of the reach of children and pets. Store at room temperature between 20 and 25 degrees C (68 and 77 degrees F). Protect from moisture. Keep the container tightly closed. Get rid of any  unused medication after the expiration date. To get rid of medications that are no longer needed or have expired: Take the medication to a medication take-back program. Check with your pharmacy or law enforcement to find a location. If you cannot return the medication, check the label or package insert to see if the medication should be thrown out in the garbage or flushed down the toilet. If you are not sure, ask your care team. If it is safe to put it in the trash, empty the medication out of the container. Mix the medication with cat litter, dirt, coffee grounds, or other unwanted substance. Seal the mixture in a bag or container. Put it in the trash. NOTE: This sheet is a summary. It may not cover all possible information. If you have questions about this medicine, talk to your doctor, pharmacist, or health care provider.  2023 Elsevier/Gold Standard (2020-11-12 00:00:00)

## 2022-02-12 NOTE — Progress Notes (Unsigned)
Subjective:   Patient ID: Craig Peters, male   DOB: 76 y.o.   MRN: [id number]   HPI Chief Complaint  Patient presents with   Foot Pain    Plantar forefoot left (2nd/3rd MPJ) - aching x 3 weeks, no injury, feels slightly swollen when walking, has increased jogging and running recently, wears good supportive shoes, sometimes feels pain in heel, calf and thigh, cold temps seem to aggravate, concerned about whole foot discoloration, no treatment   New Patient (Initial Visit)    Behind middle toe and sometimes will radiate to heel, calf, inner thigh, no necessiarly in that order.  It should be because he changed his stride. It is a point soreness. Maybe from jogging but he does not think so. No treatment. No injury. No swelling, nub/tingling.    ROS      Objective:  Physical Exam  ***     Assessment:  ***     Plan:  ***    -Prescribed mobic. Discussed side effects of the medication and directed to stop if any are to occur and call the office.  -Met pad -ABI in the office - abnormal

## 2022-02-18 ENCOUNTER — Ambulatory Visit (HOSPITAL_COMMUNITY)
Admission: RE | Admit: 2022-02-18 | Discharge: 2022-02-18 | Disposition: A | Discharge status: Home / Self Care | Payer: PPO | Source: Ambulatory Visit | Attending: Podiatry | Admitting: Podiatry

## 2022-02-18 DIAGNOSIS — I739 Peripheral vascular disease, unspecified: Secondary | ICD-10-CM | POA: Diagnosis not present

## 2022-02-22 ENCOUNTER — Other Ambulatory Visit: Payer: Self-pay | Admitting: Podiatry

## 2022-02-22 DIAGNOSIS — I739 Peripheral vascular disease, unspecified: Secondary | ICD-10-CM

## 2022-02-24 IMAGING — CR DG CHEST 2V
2 series · 2 of 2 positions shown · non-contrast
Comparison: None.

CLINICAL DATA: Preoperative study.  No chest complaints.

EXAM:
CHEST - 2 VIEW

[w chest lat]
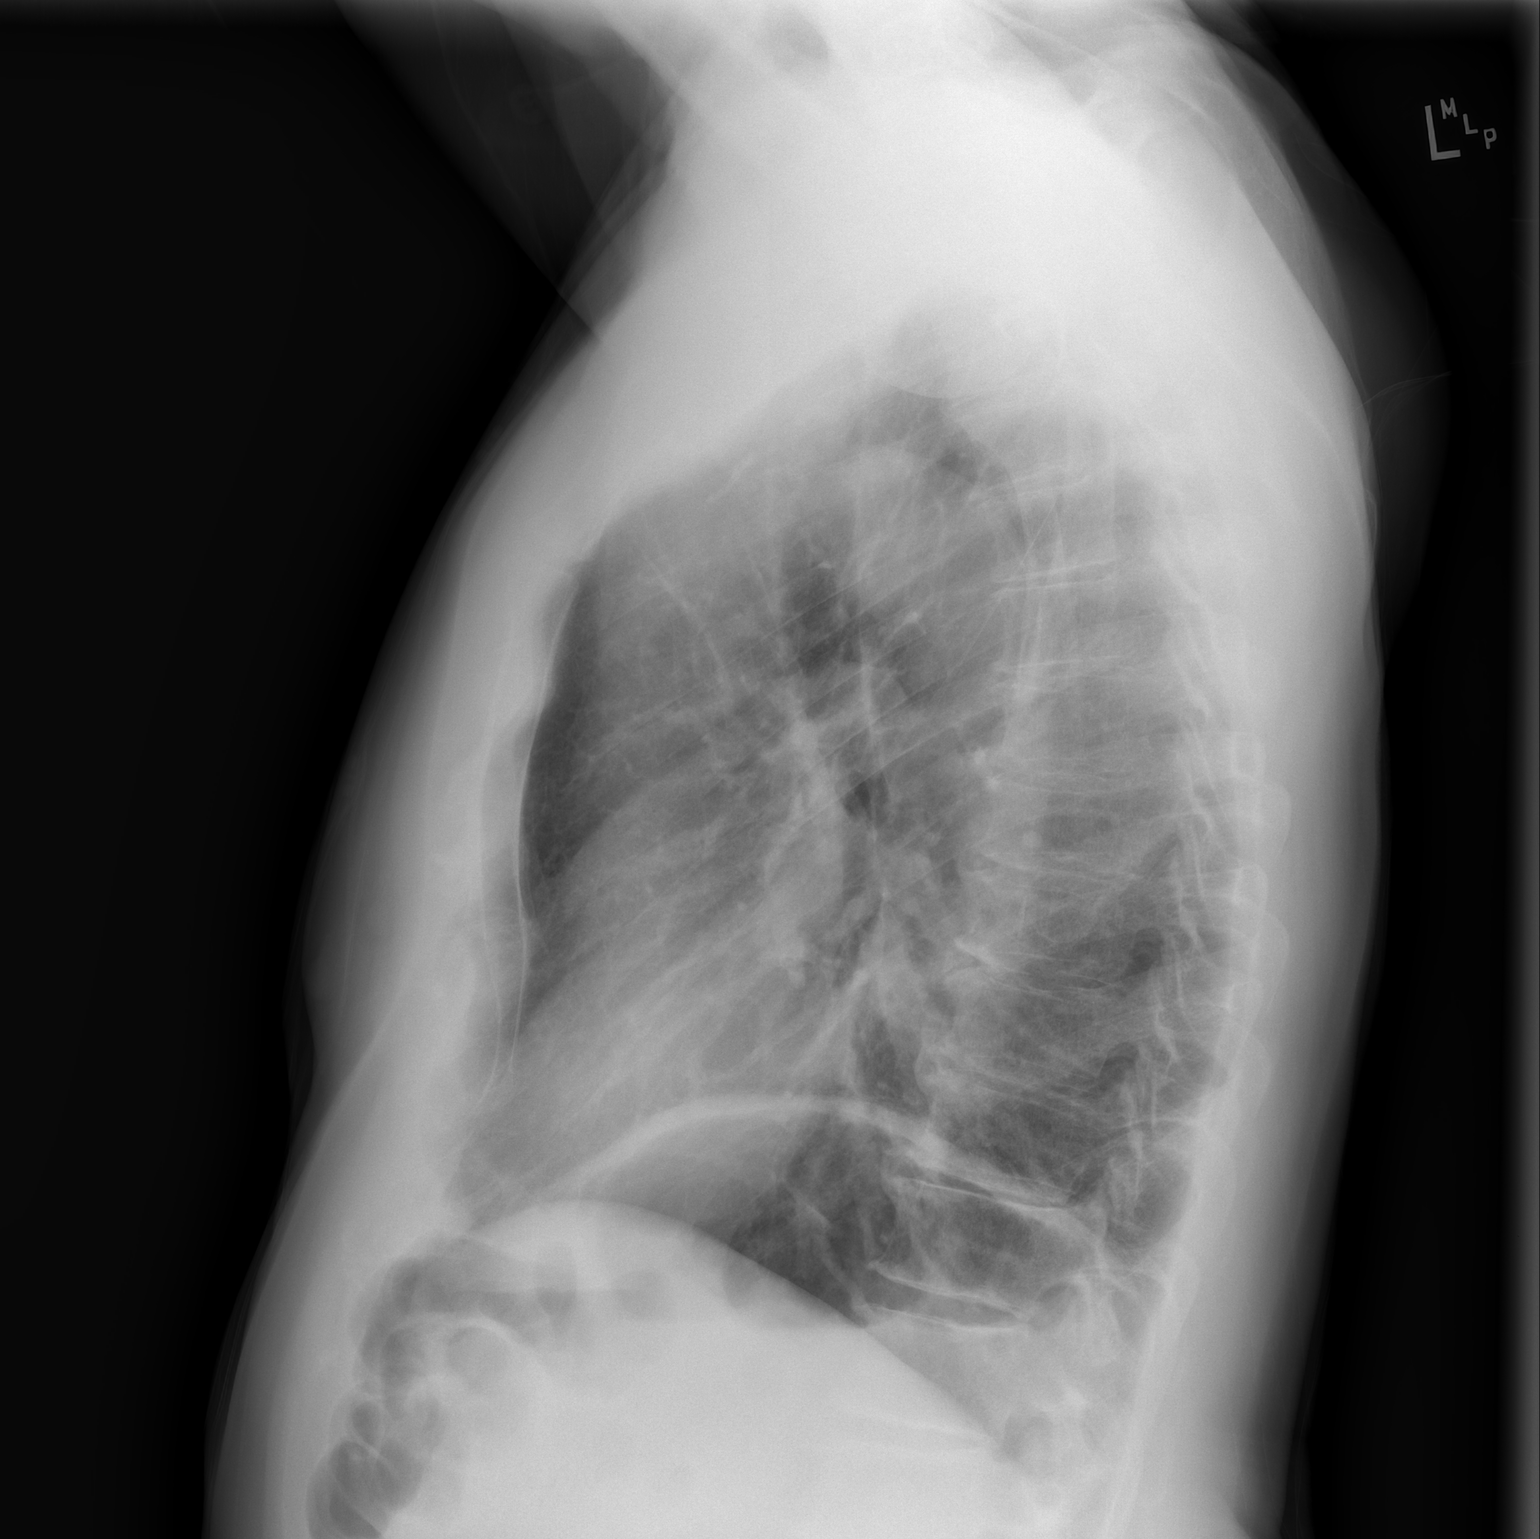

[w chest pa]
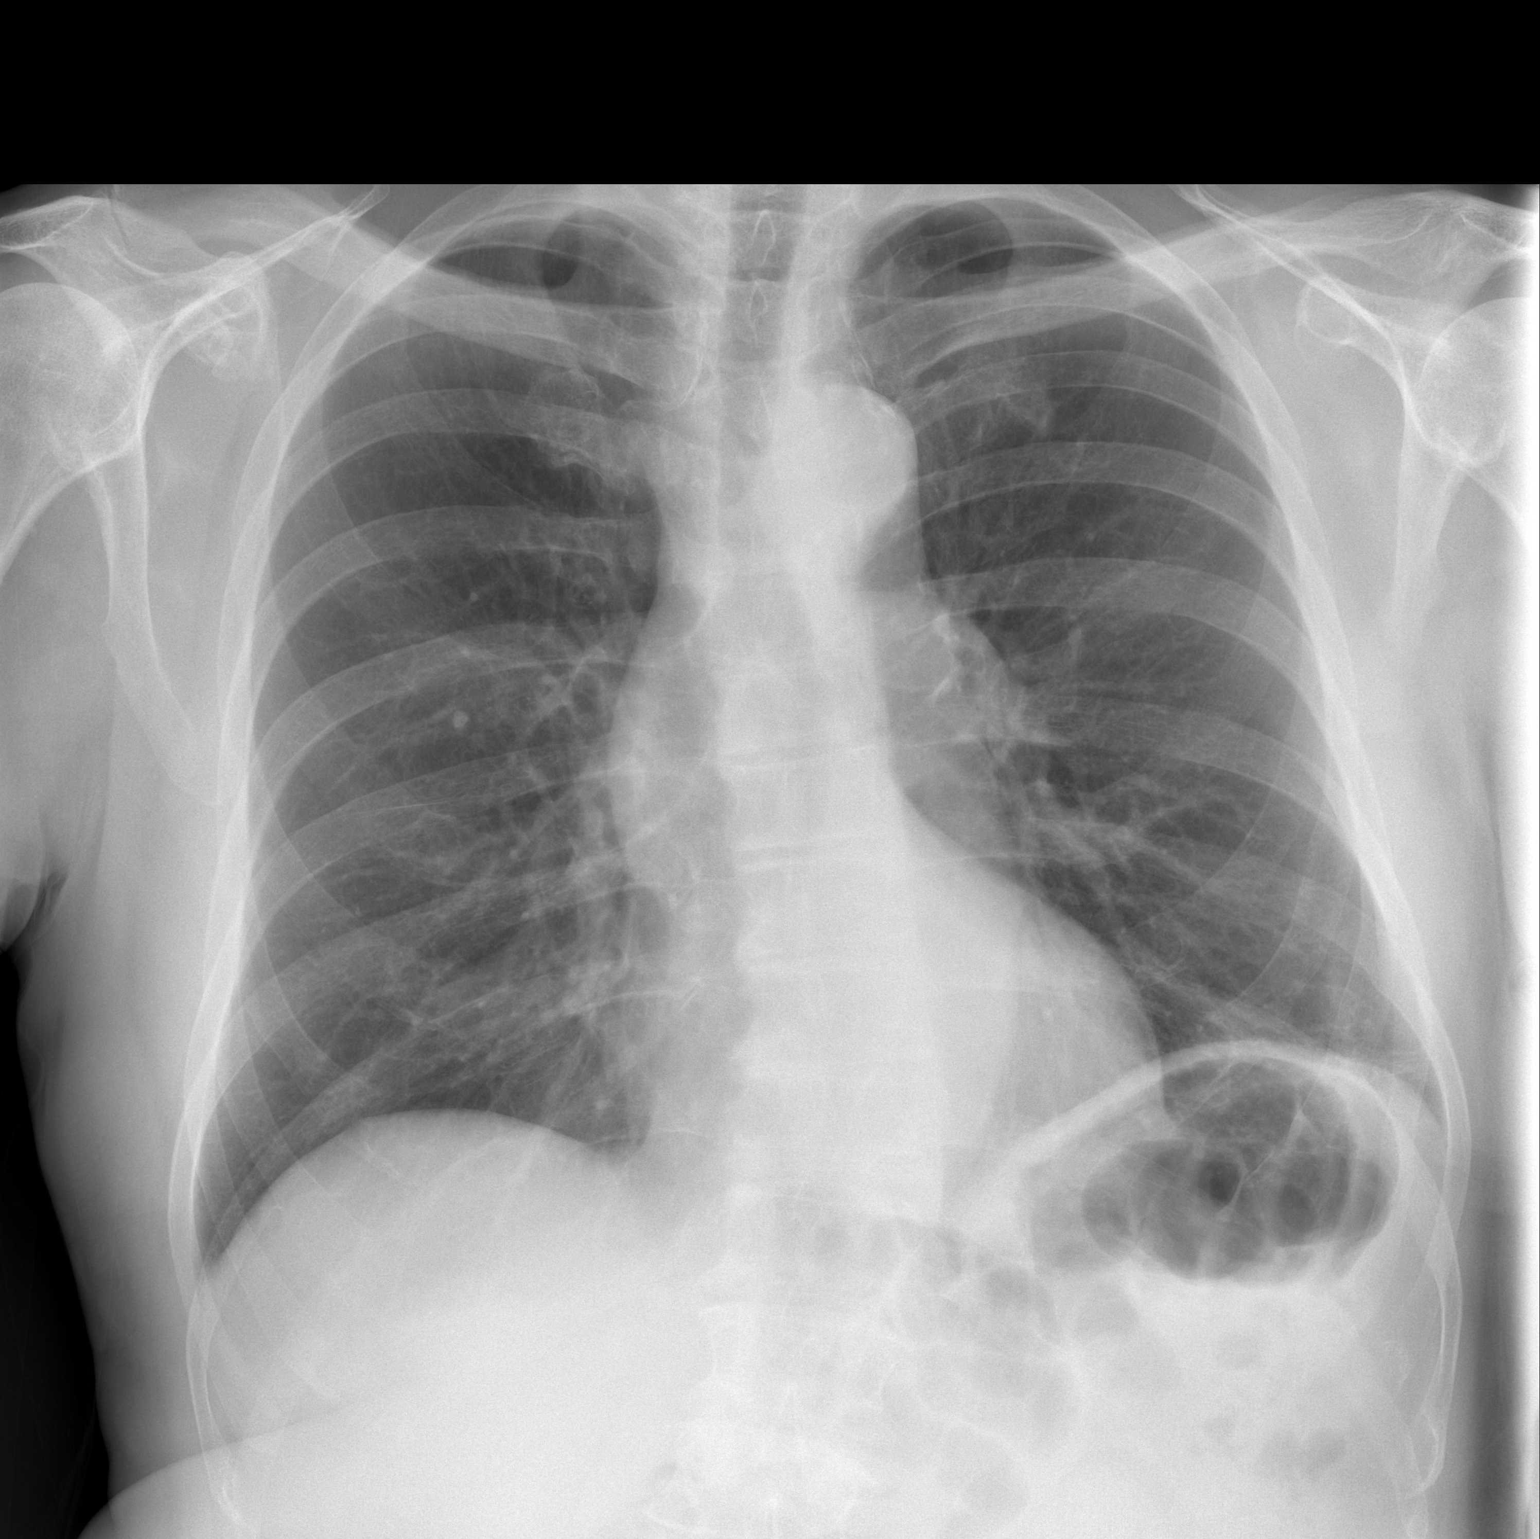

[2 of 2 positions shown; findings below may reference images not displayed]

FINDINGS: The heart size and mediastinal contours are within normal limits.
Both lungs are clear. The visualized skeletal structures are
unremarkable.
IMPRESSION: No active cardiopulmonary disease.

## 2022-02-25 NOTE — Progress Notes (Signed)
Office Note     CC: Left foot pain Requesting Provider:  Trula Slade, DPM  HPI: Craig Peters is a 76 y.o. (1945/03/22) male presenting at the request of Dr. Jacqualyn Posey left hip pain with depressed ABI.  Exam, Craig Peters is doing well.  A native of [place], he moved to [place] roughly 30 years ago.  He was a Gaffer by trade, and is now retired.  Roughly 1 year ago, Craig Peters made the decision to improve his personal health.  His goal was to run 2 miles and complete 40 push-ups daily.  While training, he appreciated point tenderness behind his left second toe.  This point tenderness occurred immediately with impact, and can radiate into the ankle and calf.  He notes more tenderness when barefoot versus when wearing a shoe.  He has been to [facility] to get fitted for shoes in an effort to provide more cushion to the area.  Craig Peters denies symptoms of claudication, ischemic rest pain, tissue loss.  Recent x-ray by Dr. Jacqualyn Posey was nondiagnostic.  The pt is not on a statin for cholesterol management.  The pt is not on a daily aspirin.   Other AC:  - The pt is not on medication for hypertension.   The pt is not diabetic.  Tobacco hx:  -  Past Medical History:  Diagnosis Date   History of adenomatous polyp of colon    Prostate cancer [facility]) urologist-- dr gay/  oncologist--- dr Tammi Klippel   dx 08/ 2021, Stage T1c, Gleason 3+4, PSA 7.12   Wears contact lenses     Past Surgical History:  Procedure Laterality Date   COLONOSCOPY  last one 2019   NO PAST SURGERIES     PROSTATE BIOPSY     RADIOACTIVE SEED IMPLANT N/A 05/26/2020   Procedure: RADIOACTIVE SEED IMPLANT/BRACHYTHERAPY IMPLANT WITH CYSTOSCOPY;  Surgeon: Janith Lima, MD;  Location: [facility];  Service: Urology;  Laterality: N/A;  ONLY NEEDS 90 MIN   SPACE OAR INSTILLATION N/A 05/26/2020   Procedure: SPACE OAR INSTILLATION;  Surgeon: Janith Lima, MD;  Location[facility];  Service:  Urology;  Laterality: N/A;    Social History   Socioeconomic History   Marital status: Single    Spouse name: Not on file   Number of children: Not on file   Years of education: Not on file   Highest education level: Not on file  Occupational History    Comment: retired  Tobacco Use   Smoking status: Former    Years: 50.00    Types: Cigarettes    Quit date: 04/02/2012    Years since quitting: 9.9   Smokeless tobacco: Never  Vaping Use   Vaping Use: Every day   Devices: unsure  Substance and Sexual Activity   Alcohol use: Yes    Alcohol/week: 0.0 standard drinks of alcohol    Comment: seldom   Drug use: No   Sexual activity: Yes  Other Topics Concern   Not on file  Social History Narrative   Not on file   Social Determinants of Health   Financial Resource Strain: Not on file  Food Insecurity: Not on file  Transportation Needs: Not on file  Physical Activity: Not on file  Stress: Not on file  Social Connections: Not on file  Intimate Partner Violence: Not on file    Family History  Problem Relation Age of Onset   Colon cancer Paternal Grandmother    Prostate cancer Neg  Hx    Pancreatic cancer Neg Hx    Breast cancer Neg Hx     Current Outpatient Medications  Medication Sig Dispense Refill   meloxicam (MOBIC) 15 MG tablet Take 1 tablet (15 mg total) by mouth daily as needed for pain. 30 tablet 0   tamsulosin (FLOMAX) 0.4 MG CAPS capsule Take 0.4-0.8 mg by mouth at bedtime.     No current facility-administered medications for this visit.    No Known Allergies   REVIEW OF SYSTEMS:   '[X]'$  denotes positive finding, '[ ]'$  denotes negative finding Cardiac  Comments:  Chest pain or chest pressure:    Shortness of breath upon exertion:    Short of breath when lying flat:    Irregular heart rhythm:        Vascular    Pain in calf, thigh, or hip brought on by ambulation:    Pain in feet at night that wakes you up from your sleep:     Blood clot in your  veins:    Leg swelling:         Pulmonary    Oxygen at home:    Productive cough:     Wheezing:         Neurologic    Sudden weakness in arms or legs:     Sudden numbness in arms or legs:     Sudden onset of difficulty speaking or slurred speech:    Temporary loss of vision in one eye:     Problems with dizziness:         Gastrointestinal    Blood in stool:     Vomited blood:         Genitourinary    Burning when urinating:     Blood in urine:        Psychiatric    Major depression:         Hematologic    Bleeding problems:    Problems with blood clotting too easily:        Skin    Rashes or ulcers:        Constitutional    Fever or chills:      PHYSICAL EXAMINATION:  There were no vitals filed for this visit.  General:  WDWN in NAD; vital signs documented above Gait: Not observed HENT: WNL, normocephalic Pulmonary: normal non-labored breathing , without wheezing Cardiac: regular HR Abdomen: soft, NT, no masses Skin: without rashes Vascular Exam/Pulses:  Right Left  Radial 2+ (normal) 2+ (normal)  Ulnar    Femoral    Popliteal    DP 2+ (normal) absent  PT     Extremities: without ischemic changes, without Gangrene , without cellulitis; without open wounds;  Musculoskeletal: no muscle wasting or atrophy  Neurologic: A&O X 3;  No focal weakness or paresthesias are detected Psychiatric:  The pt has Normal affect.   Non-Invasive Vascular Imaging:   ABI/TBIToday's ABIToday's TBIPrevious ABIPrevious TBI  +-------+-----------+-----------+------------+------------+  Right 1.16       0.78                                 +-------+-----------+-----------+------------+------------+  Left  0.75       0.62                                 +-------+-----------+-----------+------------+------------+      ASSESSMENT/PLAN:  Craig Peters is a 76 y.o. 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  I had a long conversation with Craig Peters regarding arterial insufficiency and the signs and symptoms associated.  The point tenderness he is describing proximal to the second digit in the left foot as not consistent with vascular insufficiency.  With an ABI of 0.75, I would expect Craig Peters to have some mild calf claudication after running a significant distance.  He has a toe pressure of 116 mmHg in the left great toe, which is sufficient for wound healing.  I think Craig Peters would benefit from MRI in an effort to assess possible etiology for his pain.  Craig Peters can follow-up with me as needed.  We discussed signs and symptoms of claudication, ischemic rest pain, tissue loss.  Recommend the following which can slow the progression of atherosclerosis and reduce the risk of major adverse cardiac / limb events:  Aspirin '81mg'$  PO QD.  Atorvastatin 40-'80mg'$  PO QD (or other "high intensity" statin therapy). Complete cessation from all tobacco products. Blood glucose control with goal A1c < 7%. Blood pressure control with goal blood pressure < 140/90 mmHg. Lipid reduction therapy with goal LDL-C <100 mg/dL (<70 if symptomatic from PAD).    Broadus John, MD [facility] [phone]

## 2022-02-26 ENCOUNTER — Ambulatory Visit (INDEPENDENT_AMBULATORY_CARE_PROVIDER_SITE_OTHER): Payer: PPO | Admitting: Vascular Surgery

## 2022-02-26 ENCOUNTER — Encounter: Payer: Self-pay | Admitting: Vascular Surgery

## 2022-02-26 VITALS — BP 183/93 | HR 56 | Temp 98.2°F | Resp 20 | Ht 72.0 in | Wt 176.0 lb

## 2022-02-26 DIAGNOSIS — I739 Peripheral vascular disease, unspecified: Secondary | ICD-10-CM | POA: Diagnosis not present

## 2022-04-15 ENCOUNTER — Telehealth: Payer: Self-pay | Admitting: Podiatry

## 2022-04-15 NOTE — Telephone Encounter (Signed)
Left message for pt to call Monday to schedule an appt as our office is closed tomorrow. He had put a my chart message in to get the appt.

## 2022-05-18 ENCOUNTER — Ambulatory Visit: Payer: PPO | Admitting: Podiatry

## 2022-05-18 DIAGNOSIS — C61 Malignant neoplasm of prostate: Secondary | ICD-10-CM | POA: Diagnosis not present

## 2022-05-18 DIAGNOSIS — G5762 Lesion of plantar nerve, left lower limb: Secondary | ICD-10-CM | POA: Diagnosis not present

## 2022-05-18 DIAGNOSIS — M778 Other enthesopathies, not elsewhere classified: Secondary | ICD-10-CM

## 2022-05-18 MED ORDER — TRIAMCINOLONE ACETONIDE 10 MG/ML IJ SUSP
10.0000 mg | Freq: Once | INTRAMUSCULAR | Status: AC
  Administered 2022-05-18: 10 mg

## 2022-05-18 NOTE — Patient Instructions (Signed)

## 2022-05-18 NOTE — Progress Notes (Signed)
Subjective: Chief Complaint  Patient presents with   Follow-up    Capsulitis of foot, left    77 year old male presents the office today for follow evaluation.  States he is still having discomfort in the foot but no recent injury or changes otherwise.  Objective: AAO x3, NAD DP/PT pulses palpable bilaterally, CRT less than 3 seconds Transfer patient submetatarsal left foot.  Edema second third.  Small palpable neuroma noted.  No area pinpoint tenderness.  Flexor, extensor tendons appear intact.  MMT 5/5. No pain with calf compression, swelling, warmth, erythema  Assessment: Small neuroma versus capsulitis left foot  Plan: -All treatment options discussed with the patient including all alternatives, risks, complications.  -Sterile injection performed today to the area of maximal tenderness.  Skin was cleaned alcohol mixture of 1 cc lidocaine plain, 0.5 mL of lidocaine plain, 0.5 mL's of Marcaine plain was infiltrated into the area of maximal tenderness without complications.  Postinjection care discussed.  Tolerated well. -Removable pad met gel -MRI if not improement  -Patient encouraged to call the office with any questions, concerns, change in symptoms.   Trula Slade DPM

## 2022-05-24 DIAGNOSIS — C61 Malignant neoplasm of prostate: Secondary | ICD-10-CM | POA: Diagnosis not present

## 2022-05-24 DIAGNOSIS — N401 Enlarged prostate with lower urinary tract symptoms: Secondary | ICD-10-CM | POA: Diagnosis not present

## 2022-05-24 DIAGNOSIS — R350 Frequency of micturition: Secondary | ICD-10-CM | POA: Diagnosis not present

## 2022-06-01 ENCOUNTER — Ambulatory Visit: Payer: PPO | Admitting: Podiatry

## 2022-06-15 ENCOUNTER — Ambulatory Visit (INDEPENDENT_AMBULATORY_CARE_PROVIDER_SITE_OTHER): Payer: PPO | Admitting: Podiatry

## 2022-06-15 DIAGNOSIS — G5762 Lesion of plantar nerve, left lower limb: Secondary | ICD-10-CM | POA: Diagnosis not present

## 2022-06-15 MED ORDER — TRIAMCINOLONE ACETONIDE 10 MG/ML IJ SUSP
10.0000 mg | Freq: Once | INTRAMUSCULAR | Status: AC
  Administered 2022-06-15: 10 mg

## 2022-06-15 NOTE — Patient Instructions (Signed)

## 2022-06-15 NOTE — Progress Notes (Signed)
Subjective: Chief Complaint  Patient presents with   Follow-up    Capsulitis of the left foot, no drainage, minimal pain, patient stated he's doing better     77 year old male presents the office for above concerns.  He says after the last injection he had about 60-70% decrease in pain then it settled to a 25% decrease.  He was to proceed with another injection today.  No recent injuries or changes otherwise.  Objective: AAO x3, NAD DP/PT pulses palpable bilaterally, CRT less than 3 seconds Tenderness to palpation along submetatarsal left foot mostly on second, third interspace.  Small palpable neuroma identified in the second interspace.  No area pinpoint tenderness. Flexor and extensor tendons are intact.  MMT 5/5. No pain with calf compression, swelling, warmth, erythema  Assessment: Small neuroma versus capsulitis left foot  Plan: -All treatment options discussed with the patient including all alternatives, risks, complications.  -Sterile injection performed today to the area of maximal tenderness along the area of concern for the neuroma..  Skin was cleaned alcohol mixture of 1 cc Kenalog 10, 0.5 mL of lidocaine plain, 0.5 mL's of Marcaine plain was infiltrated into the area of maximal tenderness without complications.  Postinjection care discussed.  Tolerated well. -MRI if not improement but he seems to be improving so we will hold for now.  -Patient encouraged to call the office with any questions, concerns, change in symptoms.   Trula Slade DPM

## 2022-07-20 ENCOUNTER — Ambulatory Visit (INDEPENDENT_AMBULATORY_CARE_PROVIDER_SITE_OTHER): Payer: PPO | Admitting: Podiatry

## 2022-07-20 DIAGNOSIS — G5762 Lesion of plantar nerve, left lower limb: Secondary | ICD-10-CM

## 2022-07-20 NOTE — Progress Notes (Signed)
Subjective: Chief Complaint  Patient presents with   Neuroma    Rm 13 Recheck of left 2nd interspace. Pt states little improvement in pain.     77 year old male presents the office for above concerns.  He states the injection did help about 25% he is requesting another injection today.  Objective: AAO x3, NAD DP/PT pulses palpable bilaterally, CRT less than 3 seconds Tenderness to palpation along submetatarsal left foot mostly on second interspace.  Small palpable neuroma identified in the second interspace.  No area pinpoint tenderness. Flexor and extensor tendons are intact.  MMT 5/5. No pain with calf compression, swelling, warmth, erythema  Assessment: Small neuroma versus capsulitis left foot  Plan: -All treatment options discussed with the patient including all alternatives, risks, complications.  -Sterile injection performed today to the area of maximal tenderness along the area of concern for the neuroma..  Skin was cleaned alcohol mixture of 1 cc Kenalog 10, 0.5 mL of lidocaine plain, 0.5 mL's of Marcaine plain was infiltrated into the area of maximal tenderness without complications.  Postinjection care discussed.  Tolerated well. -MRI if not improement but he seems to be improving so we will hold for now.  He is in agreement with this plan. -Patient encouraged to call the office with any questions, concerns, change in symptoms.   Vivi Barrack DPM

## 2022-07-20 NOTE — Patient Instructions (Signed)

## 2022-08-17 ENCOUNTER — Ambulatory Visit: Payer: PPO | Admitting: Podiatry

## 2022-08-17 DIAGNOSIS — G5762 Lesion of plantar nerve, left lower limb: Secondary | ICD-10-CM

## 2022-08-17 MED ORDER — TRIAMCINOLONE ACETONIDE 10 MG/ML IJ SUSP
10.0000 mg | Freq: Once | INTRAMUSCULAR | Status: AC
  Administered 2022-08-17: 10 mg

## 2022-08-17 NOTE — Progress Notes (Signed)
Subjective: Chief Complaint  Patient presents with   Neuroma    Patient came in today for left foot neuroma follow-up, forefoot, patient is doing better, rate of pain 1 out of 1,     77 year old male presents the office for above concerns.  She like to do 1 more injection.  No recent injuries or changes.  Objective: AAO x3, NAD DP/PT pulses palpable bilaterally, CRT less than 3 seconds There is slight tenderness to palpation along submetatarsal left foot mostly on second interspace.  Small palpable neuroma identified in the second interspace.  No area pinpoint tenderness. Flexor and extensor tendons are intact.  MMT 5/5. No pain with calf compression, swelling, warmth, erythema  Assessment: Small neuroma versus capsulitis left foot  Plan: -All treatment options discussed with the patient including all alternatives, risks, complications.  -Sterile injection performed today to the area of maximal tenderness along the area of concern for the neuroma.  Skin was cleaned alcohol mixture of 1 cc Kenalog 10, 0.5 mL of lidocaine plain, 0.5 mL's of Marcaine plain was infiltrated into the area of maximal tenderness without complications.  Postinjection care discussed.  Tolerated well. -Hold advanced imaging given his improvement. -Patient encouraged to call the office with any questions, concerns, change in symptoms.   Vivi Barrack DPM

## 2022-08-17 NOTE — Patient Instructions (Signed)

## 2022-08-27 DIAGNOSIS — M9904 Segmental and somatic dysfunction of sacral region: Secondary | ICD-10-CM | POA: Diagnosis not present

## 2022-08-27 DIAGNOSIS — M62830 Muscle spasm of back: Secondary | ICD-10-CM | POA: Diagnosis not present

## 2022-08-27 DIAGNOSIS — M9905 Segmental and somatic dysfunction of pelvic region: Secondary | ICD-10-CM | POA: Diagnosis not present

## 2022-08-27 DIAGNOSIS — M9903 Segmental and somatic dysfunction of lumbar region: Secondary | ICD-10-CM | POA: Diagnosis not present

## 2022-09-17 DIAGNOSIS — Z0000 Encounter for general adult medical examination without abnormal findings: Secondary | ICD-10-CM | POA: Diagnosis not present

## 2022-09-17 DIAGNOSIS — M79672 Pain in left foot: Secondary | ICD-10-CM | POA: Diagnosis not present

## 2022-09-17 DIAGNOSIS — Z23 Encounter for immunization: Secondary | ICD-10-CM | POA: Diagnosis not present

## 2022-09-17 DIAGNOSIS — E7800 Pure hypercholesterolemia, unspecified: Secondary | ICD-10-CM | POA: Diagnosis not present

## 2022-09-21 ENCOUNTER — Ambulatory Visit: Payer: PPO | Admitting: Podiatry

## 2022-09-21 ENCOUNTER — Encounter: Payer: Self-pay | Admitting: Podiatry

## 2022-09-21 DIAGNOSIS — G5762 Lesion of plantar nerve, left lower limb: Secondary | ICD-10-CM

## 2022-09-21 MED ORDER — TRIAMCINOLONE ACETONIDE 10 MG/ML IJ SUSP
10.0000 mg | Freq: Once | INTRAMUSCULAR | Status: AC
  Administered 2022-09-21: 10 mg

## 2022-09-21 NOTE — Patient Instructions (Signed)

## 2022-09-21 NOTE — Progress Notes (Signed)
Subjective: Chief Complaint  Patient presents with   Foot Pain    Follow up neuroma/capsulitis left    "Its okay, I guess. Its been 6 months, the last injection didn't really do much"    77 year old male presents the office for above concerns.  States he is having as much improvement sinus amount of discomfort wants to proceed with another steroid injection today.  No injuries or changes otherwise.  Objective: AAO x3, NAD DP/PT pulses palpable bilaterally, CRT less than 3 seconds There is still some residual tenderness to palpation along submetatarsal left foot mostly on second interspace.  Small palpable neuroma identified in the second interspace.  No area pinpoint tenderness. Flexor and extensor tendons are intact.  MMT 5/5. No pain with calf compression, swelling, warmth, erythema  Assessment: Small neuroma versus capsulitis left foot  Plan: -All treatment options discussed with the patient including all alternatives, risks, complications.  -Discussed risks of repeat injection.  Sterile injection performed today to the area of maximal tenderness along the area of concern for the neuroma.  Skin was cleaned alcohol mixture of 1 cc Kenalog 10, 0.5 mL of lidocaine plain, 0.5 mL's of Marcaine plain was infiltrated into the area of maximal tenderness without complications.  Postinjection care discussed.  Tolerated well. -Hold advanced imaging given his improvement.  Will reevaluate if symptoms persist. -Patient encouraged to call the office with any questions, concerns, change in symptoms.   Vivi Barrack DPM

## 2022-10-19 ENCOUNTER — Ambulatory Visit: Payer: PPO | Admitting: Podiatry

## 2022-10-19 DIAGNOSIS — M778 Other enthesopathies, not elsewhere classified: Secondary | ICD-10-CM

## 2022-10-19 DIAGNOSIS — G5762 Lesion of plantar nerve, left lower limb: Secondary | ICD-10-CM

## 2022-10-19 NOTE — Progress Notes (Signed)
Subjective: No chief complaint on file.  77 year old male presents the office for above concerns.  Not seen much in since last appointment.  No procedure changes otherwise.  Objective: AAO x3, NAD DP/PT pulses palpable bilaterally, CRT less than 3 seconds There is still some residual tenderness to palpation along submetatarsal left foot mostly on second interspace, further tenderness is moved more along the third MTPJ today.  There is no edema, erythema.  No area of pinpoint tenderness. No pain with calf compression, swelling, warmth, erythema  Assessment: Small neuroma versus capsulitis left foot  Plan: -All treatment options discussed with the patient including all alternatives, risks, complications.  -Discussed risks of repeat injection. He wants to proceed.  Mixture of 0.5 mL of Dexasone phosphate, 0.5 mL of Kenalog 10 and 1 cc of Marcaine was infiltrated into on the third MPJ and along the interspace complications precautions are discussed. -Continue offloading  Return in about 4 weeks (around 11/16/2022).  Vivi Barrack DPM

## 2022-10-19 NOTE — Patient Instructions (Signed)

## 2022-11-16 ENCOUNTER — Ambulatory Visit: Payer: PPO | Admitting: Podiatry

## 2022-11-16 DIAGNOSIS — M778 Other enthesopathies, not elsewhere classified: Secondary | ICD-10-CM | POA: Diagnosis not present

## 2022-11-16 DIAGNOSIS — G5762 Lesion of plantar nerve, left lower limb: Secondary | ICD-10-CM | POA: Diagnosis not present

## 2022-11-16 MED ORDER — MELOXICAM 15 MG PO TABS: 15.0000 mg | ORAL_TABLET | Freq: Every day | ORAL | 0 refills | Status: DC | PRN

## 2022-11-16 NOTE — Progress Notes (Signed)
Subjective: Chief Complaint  Patient presents with   Neuroma    Pt present today for neuroma follow up.    77 year old male presents the office for above concerns he still some immediate relief after the injection but did not last.  No recent injury or changes.  No increased swelling.   Objective: AAO x3, NAD DP/PT pulses palpable bilaterally, CRT less than 3 seconds There is still some residual tenderness to palpation along submetatarsal left foot mostly on second interspace, further tenderness is moved more along the third MTPJ today.  More the tenderness is along submetatarsal 3 today.  Not able to palpate a neuroma today.  There is no edema, erythema.  No area of pinpoint tenderness. No pain with calf compression, swelling, warmth, erythema  Assessment: Small neuroma versus capsulitis left foot  Plan: -All treatment options discussed with the patient including all alternatives, risks, complications.  -Given ongoing nature of symptoms as well as they have changed someone will order MRI of the left foot today.  He is in agreement to this.  Have any further injections. -He has metatarsal pads at home that we done previously has not tried using this again.  No follow-ups on file.  Vivi Barrack DPM

## 2022-11-23 DIAGNOSIS — C61 Malignant neoplasm of prostate: Secondary | ICD-10-CM | POA: Diagnosis not present

## 2022-11-30 DIAGNOSIS — C61 Malignant neoplasm of prostate: Secondary | ICD-10-CM | POA: Diagnosis not present

## 2022-11-30 DIAGNOSIS — R350 Frequency of micturition: Secondary | ICD-10-CM | POA: Diagnosis not present

## 2022-11-30 DIAGNOSIS — N401 Enlarged prostate with lower urinary tract symptoms: Secondary | ICD-10-CM | POA: Diagnosis not present

## 2022-12-10 ENCOUNTER — Ambulatory Visit
Admission: RE | Admit: 2022-12-10 | Discharge: 2022-12-10 | Disposition: A | Discharge status: Home / Self Care | Payer: PPO | Source: Ambulatory Visit | Attending: Podiatry

## 2022-12-10 DIAGNOSIS — G5762 Lesion of plantar nerve, left lower limb: Secondary | ICD-10-CM

## 2022-12-10 DIAGNOSIS — M778 Other enthesopathies, not elsewhere classified: Secondary | ICD-10-CM

## 2022-12-10 DIAGNOSIS — M7742 Metatarsalgia, left foot: Secondary | ICD-10-CM | POA: Diagnosis not present

## 2022-12-14 ENCOUNTER — Ambulatory Visit: Payer: PPO | Admitting: Podiatry

## 2023-01-04 ENCOUNTER — Encounter: Payer: Self-pay | Admitting: Podiatry

## 2023-01-13 DIAGNOSIS — H18053 Posterior corneal pigmentations, bilateral: Secondary | ICD-10-CM | POA: Diagnosis not present

## 2023-01-13 DIAGNOSIS — H35372 Puckering of macula, left eye: Secondary | ICD-10-CM | POA: Diagnosis not present

## 2023-01-13 DIAGNOSIS — H43813 Vitreous degeneration, bilateral: Secondary | ICD-10-CM | POA: Diagnosis not present

## 2023-01-13 DIAGNOSIS — H2513 Age-related nuclear cataract, bilateral: Secondary | ICD-10-CM | POA: Diagnosis not present

## 2023-01-25 ENCOUNTER — Ambulatory Visit: Payer: PPO | Admitting: Podiatry

## 2023-02-08 ENCOUNTER — Ambulatory Visit: Payer: PPO | Admitting: Podiatry

## 2023-02-08 DIAGNOSIS — G5762 Lesion of plantar nerve, left lower limb: Secondary | ICD-10-CM

## 2023-02-08 DIAGNOSIS — M7752 Other enthesopathy of left foot: Secondary | ICD-10-CM | POA: Diagnosis not present

## 2023-02-08 NOTE — Patient Instructions (Signed)
While at your visit today you received a steroid injection in your foot or ankle to help with your pain. Along with having the steroid medication there is some "numbing" medication in the shot that you received. Due to this you may notice some numbness to the area for the next couple of hours.  ? ?I would recommend limiting activity for the next few days to help the steroid injection take affect.  ?  ?The actually benefit from the steroid injection may take up to 2-7 days to see a difference. You may actually experience a small (as in 10%) INCREASE in pain in the first 24 hours---that is common. It would be best if you can ice the area today and take anti-inflammatory medications (such as Ibuprofen, Motrin, or Aleve) if you are able to take these medications. If you were prescribed another medication to help with the pain go ahead and start that medication today  ?  ?Things to watch out for that you should contact us or a health care provider urgently would include: ?1. Unusual (as in more than 10%) increase in pain ?2. New fever > 101.5 ?3. New swelling or redness of the injected area.  ?4. Streaking of red lines around the area injected. ? ?If you have any questions or concerns about this, please give our office a call at [phone].  ? ? ?

## 2023-02-08 NOTE — Progress Notes (Signed)
Subjective: Chief Complaint  Patient presents with   Foot Pain    RM#11 Left foot pain not getting better review imaging results done back in October.    77 year old male presents the office for follow evaluation of foot pain, MRI results.  He states that there is no return of the injection his pain started about the size of a nickel and it went down to buttock pea size.  Since he has not had any injections back to where it was.  He gets pain about the foot.  No recent injuries that he reports.   Objective: AAO x3, NAD DP/PT pulses palpable bilaterally, CRT less than 3 seconds There is still some residual tenderness to palpation along submetatarsal left foot mostly on third interspace today.  No pain on second interspace there is no area pinpoint tenderness.  There is some trace edema present but there is no erythema or warmth.  MMT 5/5. No pain with calf compression, swelling, warmth, erythema  Assessment: Bursitis, neuroma  Plan: -All treatment options discussed with the patient including all alternatives, risks, complications.  -We discussed aggressive treatment options both conservative as well as surgical.  Discussed possibly dehydrated alcohol injections but I do think that custom orthotics would beneficial for him at this point.  He was using metatarsal pads originally intermittently he started wearing a more on a consistent basis without significant relief.  I will submit authorization for [employer] therapy this will be beneficial to hopefully help event surgical intervention first pain. We did do 1 more steroid injections today.  0.5 cc of half milligram phosphate, 0.5 cc of Marcaine plain was infiltrated into the area of tenderness interspace any complications.  Postinjection care discussed.  Tolerated well.  After this we will hold off on further steroid injections. -He has metatarsal pads at home that we done previously has not tried using this again.  No follow-ups on  file.  Vivi Barrack DPM

## 2023-03-04 NOTE — Progress Notes (Addendum)
Auth received for L3020 2ea  Valid through 11.24.24-2.25.2025 Auth # [id number]

## 2023-03-11 ENCOUNTER — Encounter: Payer: Self-pay | Admitting: Podiatry

## 2023-03-11 NOTE — Progress Notes (Signed)
I have received anything back from [employer] for inserts. I re-faxed paperwork for prior authorization for inserts.

## 2023-03-14 ENCOUNTER — Telehealth: Payer: Self-pay

## 2023-03-14 NOTE — Telephone Encounter (Signed)
Auth received for L3020 2ea -foot orthotics  Valid through 11.24.24-2.25.2025 Auth # [id number]

## 2023-03-15 ENCOUNTER — Encounter: Payer: Self-pay | Admitting: Podiatry

## 2023-03-15 ENCOUNTER — Ambulatory Visit: Payer: PPO | Admitting: Podiatry

## 2023-03-15 DIAGNOSIS — G5762 Lesion of plantar nerve, left lower limb: Secondary | ICD-10-CM

## 2023-03-15 NOTE — Telephone Encounter (Signed)
Patient was scanned today for [employer]itting appt to be scheduled when product is received  Addison Bailey CPed, CFo, CFm

## 2023-03-15 NOTE — Progress Notes (Signed)
 Subjective: Chief Complaint  Patient presents with   Foot Pain    RM#13 left foot pain patient is here for injection he states gives him good relief at times wants to discuss surgery .     77 year old male presents the office for follow evaluation of foot pain.  He states he still having pain he thinks his surgeries in available but he wants to wait a couple more months.  He does request another injection today.  He does not report any recent injury or changes.  No new concerns.   Objective: AAO x3, NAD DP/PT pulses palpable bilaterally, CRT less than 3 seconds There is still some residual tenderness to palpation along submetatarsal left foot mostly on  2nd interspace today.  No pain on third interspace there is no area pinpoint tenderness.  There is some trace edema present but there is no erythema or warmth.  MMT 5/5. No pain with calf compression, swelling, warmth, erythema  Assessment: Bursitis, neuroma  Plan: -All treatment options discussed with the patient including all alternatives, risks, complications.  -His symptoms seem to vary but is mostly on second or space today and this is where an MRI he has a likely neuroma. -We discussed both conservative as well as surgical options.  He wants to consider surgical and to wait a couple more months and do this over the summer.  He is more concerned with her postop recovery which we discussed today. -We did skin discussed injections and risks of repeated injections and he is well aware of this and wishes to proceed.  I cleaned the skin with alcohol and mixture of 0.5 cc of dexamethasone  phosphate, 0.5 cc Marcaine plain was infiltrated into the second interspace on the area of maximal tenderness without any complications today.  Postinjection care discussed.  Tolerated well. -He was measured for orthotics today by Lolita, pedorthist.  Donnice JONELLE Fees DPM

## 2023-04-19 ENCOUNTER — Encounter: Payer: Self-pay | Admitting: Podiatry

## 2023-04-19 ENCOUNTER — Ambulatory Visit (INDEPENDENT_AMBULATORY_CARE_PROVIDER_SITE_OTHER): Payer: HMO | Admitting: Podiatry

## 2023-04-19 DIAGNOSIS — M7751 Other enthesopathy of right foot: Secondary | ICD-10-CM

## 2023-04-19 DIAGNOSIS — M2141 Flat foot [pes planus] (acquired), right foot: Secondary | ICD-10-CM | POA: Diagnosis not present

## 2023-04-19 DIAGNOSIS — M2142 Flat foot [pes planus] (acquired), left foot: Secondary | ICD-10-CM | POA: Diagnosis not present

## 2023-04-19 DIAGNOSIS — G5762 Lesion of plantar nerve, left lower limb: Secondary | ICD-10-CM | POA: Diagnosis not present

## 2023-04-19 DIAGNOSIS — M778 Other enthesopathies, not elsewhere classified: Secondary | ICD-10-CM

## 2023-04-19 DIAGNOSIS — M7752 Other enthesopathy of left foot: Secondary | ICD-10-CM | POA: Diagnosis not present

## 2023-04-19 NOTE — Progress Notes (Signed)
 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   Orthotics were dispensed and fit was satisfactory. Reviewed instructions for break-in and wear. Written instructions given to patient.  Patient will follow up as needed. Lolita Schultze CPed, CFo, CFm

## 2023-04-19 NOTE — Progress Notes (Signed)
 Subjective: Chief Complaint  Patient presents with   Neuroma    RM#13 Patient here for follow up of left foot states not any better still the same.     78 year old male presents the office for follow evaluation of foot pain.  He states that the injections have not been helpful recently.  He does want to consider surgery but not show June.  States that he has been trying to injections to delay surgery.  No recent injury or changes otherwise.  Also presents to the pick up orthotics.    Objective: AAO x3, NAD DP/PT pulses palpable bilaterally, CRT less than 3 seconds There is still the majority of tenderness to palpation along submetatarsal left foot mostly on  2nd interspace today.  No pain on third interspace there is no area pinpoint tenderness.  There is some trace edema present but there is no erythema or warmth.  MMT 5/5. No pain with calf compression, swelling, warmth, erythema  Assessment: Bursitis, neuroma  Plan: -All treatment options discussed with the patient including all alternatives, risks, complications.  -Discussed with conservative as well as surgical options.  Injections have not been helpful I did recommend holding off on further steroid injections.  Discussed other conservative treatment options as well.  He feels that surgery is also notable with numbness in June.  For now we will hold off on injections to continue shoes, good support.  Orthotics were dispensed today as well by Lolita, pedorthist.  See separate note for this. -He will consider surgical follow-up for scheduling, consent if he wants to proceed.  Return in about 4 weeks (around 05/17/2023) for neuroma, surgery consult.  Craig Peters Fees DPM

## 2023-04-25 ENCOUNTER — Emergency Department (HOSPITAL_COMMUNITY)
Admission: EM | Admit: 2023-04-25 | Discharge: 2023-04-25 | Disposition: A | Discharge status: Home / Self Care | Payer: HMO | Attending: Emergency Medicine | Admitting: Emergency Medicine

## 2023-04-25 ENCOUNTER — Other Ambulatory Visit: Payer: Self-pay

## 2023-04-25 DIAGNOSIS — R040 Epistaxis: Secondary | ICD-10-CM | POA: Insufficient documentation

## 2023-04-25 DIAGNOSIS — Z8546 Personal history of malignant neoplasm of prostate: Secondary | ICD-10-CM | POA: Insufficient documentation

## 2023-04-25 LAB — COMPREHENSIVE METABOLIC PANEL
ALT: 17 U/L (ref 0–44)
AST: 21 U/L (ref 15–41)
Albumin: 3.9 g/dL (ref 3.5–5.0)
Alkaline Phosphatase: 53 U/L (ref 38–126)
Anion gap: 10 (ref 5–15)
BUN: 29 mg/dL — ABNORMAL HIGH (ref 8–23)
CO2: 23 mmol/L (ref 22–32)
Calcium: 9.1 mg/dL (ref 8.9–10.3)
Chloride: 107 mmol/L (ref 98–111)
Creatinine, Ser: 0.8 mg/dL (ref 0.61–1.24)
GFR, Estimated: >60 mL/min (ref >60)
Glucose, Bld: 110 mg/dL — ABNORMAL HIGH (ref 70–99)
Potassium: 4.3 mmol/L (ref 3.5–5.1)
Sodium: 140 mmol/L (ref 135–145)
Total Bilirubin: 1.3 mg/dL — ABNORMAL HIGH (ref 0.0–1.2)
Total Protein: 7 g/dL (ref 6.5–8.1)

## 2023-04-25 LAB — CBC WITH DIFFERENTIAL/PLATELET
Abs Immature Granulocytes: 0.02 10*3/uL (ref 0.00–0.07)
Basophils Absolute: 0 10*3/uL (ref 0.0–0.1)
Basophils Relative: 1 %
Eosinophils Absolute: 0.1 10*3/uL (ref 0.0–0.5)
Eosinophils Relative: 2 %
HCT: 43.1 % (ref 39.0–52.0)
Hemoglobin: 14.3 g/dL (ref 13.0–17.0)
Immature Granulocytes: 0 %
Lymphocytes Relative: 23 %
Lymphs Abs: 1.6 10*3/uL (ref 0.7–4.0)
MCH: 30.3 pg (ref 26.0–34.0)
MCHC: 33.2 g/dL (ref 30.0–36.0)
MCV: 91.3 fL (ref 80.0–100.0)
Monocytes Absolute: 0.6 10*3/uL (ref 0.1–1.0)
Monocytes Relative: 9 %
Neutro Abs: 4.6 10*3/uL (ref 1.7–7.7)
Neutrophils Relative %: 65 %
Platelets: 294 10*3/uL (ref 150–400)
RBC: 4.72 MIL/uL (ref 4.22–5.81)
RDW: 13.6 % (ref 11.5–15.5)
WBC: 7 10*3/uL (ref 4.0–10.5)
nRBC: 0 % (ref 0.0–0.2)

## 2023-04-25 MED ORDER — OXYMETAZOLINE HCL 0.05 % NA SOLN
1.0000 | Freq: Once | NASAL | Status: AC
  Administered 2023-04-25: 1 via NASAL
  Filled 2023-04-25: qty 30

## 2023-04-25 MED ORDER — SILVER NITRATE-POT NITRATE 75-25 % EX MISC
1.0000 | Freq: Once | CUTANEOUS | Status: AC
  Administered 2023-04-25: 1 via TOPICAL
  Filled 2023-04-25: qty 10

## 2023-04-25 MED ORDER — TRANEXAMIC ACID FOR EPISTAXIS: 500.0000 mg | Freq: Once | TOPICAL | Status: DC

## 2023-04-25 NOTE — ED Provider Notes (Signed)
 [place] EMERGENCY DEPARTMENT AT [facility] Provider Note   CSN: [id number] Arrival date & time: 04/25/23  1509     History  Chief Complaint  Patient presents with   Epistaxis    Craig Peters is a 78 y.o. male.   Epistaxis  Patient presents to ED with a 2-hour history of epistaxis from left nare that started suddenly.  Says that he had previous epistaxis 2 days ago but that it stopped without intervention.  Previous medical history of malignant neoplasm of the prostate.  Patient is not on any blood thinners and denies any dizziness, fatigue, shortness of breath, headache, vision changes, weakness.    Home Medications Prior to Admission medications   Medication Sig Start Date End Date Taking? Authorizing Provider  meloxicam (MOBIC) 15 MG tablet Take 1 tablet (15 mg total) by mouth daily as needed for pain. 11/16/22 11/16/23  Vivi Barrack, DPM  tamsulosin (FLOMAX) 0.4 MG CAPS capsule Take 0.4 mg by mouth at bedtime. 05/24/22   [provider]      Allergies    Patient has no known allergies.    Review of Systems   Review of Systems  HENT:  Positive for nosebleeds.   All other systems reviewed and are negative.   Physical Exam Updated Vital Signs BP (!) 149/117   Pulse (!) 48   Temp 97.9 F (36.6 C) (Oral)   Resp 16   Wt 79.8 kg   SpO2 98%   BMI 23.87 kg/m  Physical Exam Vitals and nursing note reviewed.  Constitutional:      Appearance: Normal appearance.  HENT:     Head: Normocephalic and atraumatic.     Nose:     Comments: Anterior nosebleed noted in left nare.  Right nare was unremarkable.  No facial trauma noted.    Mouth/Throat:     Mouth: Mucous membranes are moist.     Pharynx: Oropharynx is clear. No oropharyngeal exudate or posterior oropharyngeal erythema.     Comments: No blood noted in the oropharynx. Eyes:     General: No scleral icterus.       Right eye: No discharge.        Left eye: No discharge.      Extraocular Movements: Extraocular movements intact.     Conjunctiva/sclera: Conjunctivae normal.  Cardiovascular:     Rate and Rhythm: Normal rate and regular rhythm.     Pulses: Normal pulses.     Heart sounds: Normal heart sounds. No murmur heard.    No friction rub. No gallop.  Pulmonary:     Effort: Pulmonary effort is normal. No respiratory distress.     Breath sounds: Normal breath sounds.  Abdominal:     General: Abdomen is flat.     Palpations: Abdomen is soft.     Tenderness: There is no abdominal tenderness.  Musculoskeletal:     Cervical back: No rigidity.  Skin:    General: Skin is warm and dry.  Neurological:     General: No focal deficit present.     Mental Status: He is alert. Mental status is at baseline.  Psychiatric:        Mood and Affect: Mood normal.     ED Results / Procedures / Treatments   Labs (all labs ordered are listed, but only abnormal results are displayed) Labs Reviewed  COMPREHENSIVE METABOLIC PANEL - Abnormal; Notable for the following components:      Result Value   Glucose, Bld  110 (*)    BUN 29 (*)    Total Bilirubin 1.3 (*)    All other components within normal limits  CBC WITH DIFFERENTIAL/PLATELET    EKG None  Radiology No results found.  Procedures Procedures    Medications Ordered in ED Medications  oxymetazoline (AFRIN) 0.05 % nasal spray 1 spray (1 spray Each Nare Given 04/25/23 1531)  silver nitrate applicators applicator 1 Application (1 Application Topical Given by Other 04/25/23 1746)    ED Course/ Medical Decision Making/ A&P                                 Medical Decision Making Amount and/or Complexity of Data Reviewed Labs: ordered.  Risk OTC drugs. Prescription drug management.   This patient is a 78 year old male who presents to the ED for concern of epistaxis x 2 hours.   Differential diagnoses prior to evaluation: The emergent differential diagnosis includes, but is not limited to, trauma,  coagulopathy, tumor, hemophilia, AVM. This is not an exhaustive differential.   Past Medical History / Co-morbidities / Social History: Prostate cancer, hypercholesteremia  Additional history: Chart reviewed. Pertinent results include:   Patient has not been seen prior for any epistaxis according to trauma.  Lab Tests/Imaging studies: I personally interpreted labs/imaging and the pertinent results include:   CBC is unremarkable CMP is notable for moderately elevated BUN 29    Medications: I ordered medication including Afrin.  I have reviewed the patients home medicines and have made adjustments as needed.  ED Course:  Patient is a 78 year old male presents the ED today complaining of a 2-hour history of epistaxis out of the left nare.  States this began suddenly but has had a previous episode of this 2 days ago which resolved spontaneously.  Denies any concerning symptoms at this time.  On physical exam light bleeding was noted of the left nare with notable anterior nosebleed.  No trauma noted.  Exam is otherwise unremarkable.  Began treatment with direct pressure which did not resolve symptoms, Afrin was also given.  On reevaluation patient was still bleeding despite 20 minutes of direct pressure and Afrin.  The application of Afrin was then done with more direct pressure.  Patient rule out clot between each dose.  Patient was notably still bleeding at that time.  Dr. Jearld Fenton then applied silver nitrate to the anterior bleed.  Patient was noted to still be bleeding at that time and so a 5 and half centimeter Rhino Rocket was then inserted at that time.  Bleeding slowed and on reevaluation was notably improved with no active bleed seen at that time.  Patient at this time was wishing to leave without any further evaluation.  Patient vital signs remained stable at the course of the time here.  Patient was also notably able to walk without difficulty and did not have any concerning symptoms.   Referred him to ENT to have Rhino Rocket removed in 1 to 2 days.  I believe this patient is a discharge this time however provided strict return to ED precautions due to him wishing to leave without being further evaluated at this time.   Disposition: After consideration of the diagnostic results and the patients response to treatment, I feel that the patient benefit from discharge and treatment as above.   emergency department workup does not suggest an emergent condition requiring admission or immediate intervention beyond what has been performed at  this time. The plan is: Follow-up with ENT in 1 to 2 days for removal of Rhino Rocket, further monitoring of symptoms as well as bleeding and return to ED if bleeding persists or symptoms worsen. The patient is safe for discharge and has been instructed to return immediately for worsening symptoms, change in symptoms or any other concerns.   Final Clinical Impression(s) / ED Diagnoses Final diagnoses:  Epistaxis    Rx / DC Orders ED Discharge Orders     None         Lunette Stands, PA-C 04/25/23 1825    Loetta Rough, MD 04/30/23 1055

## 2023-04-25 NOTE — ED Notes (Signed)
 Afrin sprayed, holding self pressure

## 2023-04-25 NOTE — Discharge Instructions (Addendum)
 You were seen today for nosebleed.  We have placed a 5-1/2 cm Rhino Rocket in to the left nare at this time.  Need to have this removed by ENT in 1 to 2 days.  I have provided information for this ENT which you are to call and get scheduled for a visit to have this removed and evaluated.  If you continue to have worsening bleeding, or begin having any fever, dizziness, blurry vision, weakness, confusion, shortness of breath return to the ED immediately for evaluation.

## 2023-04-25 NOTE — ED Triage Notes (Signed)
 Here by POV, drove self from home,here for  nosebleed, no h/o similar, onset 2 hrs ago, denies injury, dizziness, pain or nausea. Denies blood thinners or ASA. Alert, NAD, calm, interactive, reps e/u. No vomiting. Holding self-pressure. Describes as L nare, trickling over to R and down throat.

## 2023-04-26 ENCOUNTER — Encounter (INDEPENDENT_AMBULATORY_CARE_PROVIDER_SITE_OTHER): Payer: Self-pay

## 2023-04-26 ENCOUNTER — Ambulatory Visit (INDEPENDENT_AMBULATORY_CARE_PROVIDER_SITE_OTHER): Payer: HMO | Admitting: Otolaryngology

## 2023-04-26 VITALS — BP 161/119 | HR 94 | Ht 72.0 in | Wt 175.0 lb

## 2023-04-26 DIAGNOSIS — R040 Epistaxis: Secondary | ICD-10-CM | POA: Insufficient documentation

## 2023-04-26 NOTE — Progress Notes (Signed)
Patient ID: Craig Peters, male   DOB: 1945-12-21, 78 y.o.   MRN: [id number]  CC: Recurrent left epistaxis  HPI:  Craig Peters is a 78 y.o. male who presents today complaining of recurrent left epistaxis.  According to the patient, he had 2 episodes of left-sided epistaxis last week.  The second episode was yesterday, which was severe.  He was evaluated at the [facility] emergency room.  The bleeding was eventually controlled with a nasal packing.  His blood pressure was noted to be elevated at 149/117 at the emergency room.  The patient is not on any blood pressure medication.  The patient uses ibuprofen periodically.  He is not on any other blood thinner.  He has no previous nasal surgery.  He also denies any recent nasal trauma.  Past Medical History:  Diagnosis Date   History of adenomatous polyp of colon    Prostate cancer [facility]) urologist-- dr gay/  oncologist--- dr Kathrynn Running   dx 08/ 2021, Stage T1c, Gleason 3+4, PSA 7.12   Wears contact lenses     Past Surgical History:  Procedure Laterality Date   COLONOSCOPY  last one 2019   NO PAST SURGERIES     PROSTATE BIOPSY     RADIOACTIVE SEED IMPLANT N/A 05/26/2020   Procedure: RADIOACTIVE SEED IMPLANT/BRACHYTHERAPY IMPLANT WITH CYSTOSCOPY;  Surgeon: Jannifer Hick, MD;  Location: [facility];  Service: Urology;  Laterality: N/A;  ONLY NEEDS 90 MIN   SPACE OAR INSTILLATION N/A 05/26/2020   Procedure: SPACE OAR INSTILLATION;  Surgeon: Jannifer Hick, MD;  [facility];  Service: Urology;  Laterality: N/A;    Family History  Problem Relation Age of Onset   Colon cancer Paternal Grandmother    Prostate cancer Neg Hx    Pancreatic cancer Neg Hx    Breast cancer Neg Hx     Social History:  reports that he quit smoking about 11 years ago. His smoking use included cigarettes. He started smoking about 61 years ago. He has never used smokeless tobacco. He reports current alcohol use. He reports that  he does not use drugs.  Allergies: No Known Allergies  Prior to Admission medications   Medication Sig Start Date End Date Taking? Authorizing Provider  tamsulosin (FLOMAX) 0.4 MG CAPS capsule Take 0.4 mg by mouth at bedtime. 05/24/22  Yes [provider]  meloxicam (MOBIC) 15 MG tablet Take 1 tablet (15 mg total) by mouth daily as needed for pain. Patient not taking: Reported on 04/26/2023 11/16/22 11/16/23  Vivi Barrack, DPM    Blood pressure (!) 161/119, pulse 94, height 6' (1.829 m), weight 175 lb (79.4 kg), SpO2 96%. Exam: General: Communicates without difficulty, well nourished, no acute distress. Head: Normocephalic, no evidence injury, no tenderness, facial buttresses intact without stepoff. Face/sinus: No tenderness to palpation and percussion. Facial movement is normal and symmetric. Eyes: PERRL, EOMI. No scleral icterus, conjunctivae clear. Neuro: CN II exam reveals vision grossly intact.  No nystagmus at any point of gaze. Ears: Auricles well formed without lesions.  Ear canals are intact without mass or lesion.  No erythema or edema is appreciated.  The TMs are intact without fluid. Nose: External evaluation reveals normal support and skin without lesions.  Dorsum is intact.  Anterior rhinoscopy reveals bleeding from the left nasal septum.  Oral:  Oral cavity and oropharynx are intact, symmetric, without erythema or edema.  Mucosa is moist without lesions. Neck: Full range of motion without  pain.  There is no significant lymphadenopathy.  No masses palpable.  Thyroid bed within normal limits to palpation.  Parotid glands and submandibular glands equal bilaterally without mass.  Trachea is midline. Neuro:  CN 2-12 grossly intact.   Procedure:  Endoscopic control of recurrent left epistaxis. Indication:  Recurrent epistaxis  Description:  The left nasal cavity is sprayed with topical xylocaine and neo-synephrine.  After adequate anesthesia is achieved, the nasal cavity is  examined with a 0 rigid endoscope.  A suction catheter is inserted into parallel with the 0 endoscope, and it is used to suction blood clots from the nasal cavity.  Several hypervascular areas are noted on the anterior and superior portion of the septum. Active bleeding is noted. A silver nitrate stick is inserted in parallel with the 0 endoscope.  It is used to repeatedly cauterized the hypervascular areas.  Good hemostasis is achieved.  The patient tolerated the procedure well.    Assessment: 1.  Recurrent left epistaxis.  Hypervascular areas are noted on the left anterior and superior nasal septum. 2.  No suspicious mass or lesion is noted today. 3.  Persistent hypertension.  His blood pressure is currently 177/117.  He was also hypotensive yesterday at the emergency room.  Plan: 1.  The physical exam findings are reviewed with the patient. 2.  Endoscopic cauterization of the left nasal septum. 3.  Humidifier/nasal ointment during the winter months. 4.  The patient will follow-up with his primary care physician regarding his high blood pressure. 5.  The patient will return for reevaluation in 3 weeks, sooner if needed.  Marybel Alcott W Michah Minton 04/26/2023, 1:36 PM

## 2023-04-30 NOTE — ED Provider Notes (Signed)
 Epistaxis Management  Date/Time: 04/30/2023 10:56 AM  Performed by: Loetta Rough, MD Authorized by: Loetta Rough, MD   Consent:    Consent obtained:  Verbal   Consent given by:  Patient   Risks discussed:  Bleeding, infection, nasal injury and pain   Alternatives discussed:  No treatment and alternative treatment Universal protocol:    Patient identity confirmed:  Verbally with patient Procedure details:    Treatment site:  L anterior   Treatment method:  Silver nitrate and anterior pack   Treatment episode: recurring   Post-procedure details:    Assessment:  Bleeding stopped   Procedure completion:  Tolerated well, no immediate complications     Loetta Rough, MD 04/30/23 1056

## 2023-05-17 ENCOUNTER — Ambulatory Visit: Payer: HMO | Admitting: Podiatry

## 2023-05-17 ENCOUNTER — Encounter (INDEPENDENT_AMBULATORY_CARE_PROVIDER_SITE_OTHER): Payer: Self-pay

## 2023-05-17 ENCOUNTER — Ambulatory Visit (INDEPENDENT_AMBULATORY_CARE_PROVIDER_SITE_OTHER): Payer: HMO | Admitting: Otolaryngology

## 2023-05-17 ENCOUNTER — Telehealth: Payer: Self-pay

## 2023-05-17 VITALS — BP 133/95 | HR 66 | Ht 72.0 in | Wt 180.0 lb

## 2023-05-17 DIAGNOSIS — G5762 Lesion of plantar nerve, left lower limb: Secondary | ICD-10-CM | POA: Diagnosis not present

## 2023-05-17 DIAGNOSIS — R040 Epistaxis: Secondary | ICD-10-CM

## 2023-05-17 DIAGNOSIS — M7752 Other enthesopathy of left foot: Secondary | ICD-10-CM | POA: Diagnosis not present

## 2023-05-17 DIAGNOSIS — I739 Peripheral vascular disease, unspecified: Secondary | ICD-10-CM | POA: Diagnosis not present

## 2023-05-17 NOTE — Telephone Encounter (Signed)
 Received surgery paperwork from Dr. Ardelle Anton. Left a message for [facility] to call and schedule surgery.

## 2023-05-17 NOTE — Patient Instructions (Signed)

## 2023-05-17 NOTE — Progress Notes (Unsigned)
 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    78 year old male presents the office for follow evaluation of foot pain.  States he is still having pain but surgery last couple of months.  Attempted injections, shoe modifications, orthotics appears to have an MRI.  No recent injuries or changes otherwise.  Objective: AAO x3, NAD DP/PT pulses palpable bilaterally, CRT less than 3 seconds There is still the majority of tenderness to palpation along submetatarsal left foot mostly on  2nd interspace today.  No significant pain on the left third interspace today but there is some discomfort submetatarsal 3.  There is callus formation submetatarsal area but there is no ulcerations. There is some trace edema present but there is no erythema or warmth.  MMT 5/5. No pain with calf compression, swelling, warmth, erythema  Assessment: Bursitis, neuroma  Plan: -All treatment options discussed with the patient including all alternatives, risks, complications.  -Discussed with conservative as well as surgical options.  At this time he would just proceed with surgical intervention.  Discussed this is not a guarantee of resolution of symptoms and is something long-term need for orthotics given the prominent metatarsal heads, callus formation.  Discussed with them neurectomy versus neuroplasty.  Proceed with surgery. The incision placement as well as the postoperative course was discussed with the patient. I discussed risks of the surgery which include, but not limited to, infection, bleeding, pain, swelling, need for further surgery, delayed or nonhealing, painful or ugly scar, numbness or sensation changes, over/under correction, recurrence, transfer lesions, further deformity, hardware failure, DVT/PE, loss of toe/foot. Patient understands these risks and wishes to proceed with surgery. The surgical consent was reviewed with the patient all 3 pages  were signed. No promises or guarantees were given to the outcome of the procedure. All questions were answered to the best of my ability. Before the surgery the patient was encouraged to call the office if there is any further questions. The surgery will be performed at the [facility] on an outpatient basis. -Updated ABI ordered to ensure adequate healing  No follow-ups on file.  Vivi Barrack DPM

## 2023-05-17 NOTE — Progress Notes (Signed)
 Patient ID: Craig Peters, male   DOB: May 25, 1945, 78 y.o.   MRN: [id number]  Follow-up: Recurrent left epistaxis  HPI: The patient is a 78 year old male who returns today for his follow-up evaluation.  He was last seen 3 weeks ago.  At that time, he was experiencing recurrent left epistaxis.  His blood pressure was significantly elevated.  Hypervascular areas were noted on his left anterior and superior nasal septum.  He was treated with endoscopic cauterization of his left nasal septum.  He was also started on a blood pressure medication by his primary care physician.  The patient returns today reporting no more bleeding.  He is able to breathe through both nostrils.  He denies any recent nasal trauma.  Exam: General: Communicates without difficulty, well nourished, no acute distress. Head: Normocephalic, no evidence injury, no tenderness, facial buttresses intact without stepoff. Face/sinus: No tenderness to palpation and percussion. Facial movement is normal and symmetric. Eyes: PERRL, EOMI. No scleral icterus, conjunctivae clear. Neuro: CN II exam reveals vision grossly intact.  No nystagmus at any point of gaze. Ears: Auricles well formed without lesions.  Ear canals are intact without mass or lesion.  No erythema or edema is appreciated.  The TMs are intact without fluid. Nose: External evaluation reveals normal support and skin without lesions.  Dorsum is intact.  Anterior rhinoscopy reveals congested mucosa over anterior aspect of inferior turbinates and intact septum.  No purulence noted. Oral:  Oral cavity and oropharynx are intact, symmetric, without erythema or edema.  Mucosa is moist without lesions. Neck: Full range of motion without pain.  There is no significant lymphadenopathy.  No masses palpable.  Thyroid bed within normal limits to palpation.  Parotid glands and submandibular glands equal bilaterally without mass.  Trachea is midline. Neuro:  CN 2-12 grossly intact.   Assessment: 1.   The patient's recurrent left epistaxis is currently under control. 2.  No hypervascular area or active bleeding is noted today. 3.  His blood pressure is currently under control with his new hypertension medication.  Plan: 1.  The physical exam findings are reviewed with the patient. 2.  Continue the use of humidifier/nasal ointment during the winter months. 3.  The patient is encouraged to call with any questions or concerns.

## 2023-05-24 ENCOUNTER — Ambulatory Visit (HOSPITAL_COMMUNITY)
Admission: RE | Admit: 2023-05-24 | Discharge: 2023-05-24 | Disposition: A | Discharge status: Home / Self Care | Source: Ambulatory Visit | Attending: Podiatry | Admitting: Podiatry

## 2023-05-24 ENCOUNTER — Encounter: Payer: Self-pay | Admitting: Podiatry

## 2023-05-24 DIAGNOSIS — I739 Peripheral vascular disease, unspecified: Secondary | ICD-10-CM | POA: Diagnosis not present

## 2023-05-24 LAB — VAS US ABI WITH/WO TBI
Left ABI: 0.75
Right ABI: 1.27

## 2023-05-31 DIAGNOSIS — E7800 Pure hypercholesterolemia, unspecified: Secondary | ICD-10-CM | POA: Diagnosis not present

## 2023-05-31 DIAGNOSIS — I10 Essential (primary) hypertension: Secondary | ICD-10-CM | POA: Diagnosis not present

## 2023-06-03 ENCOUNTER — Telehealth: Payer: Self-pay

## 2023-06-03 NOTE — Telephone Encounter (Signed)
 Patient called and left a message - he is returning your call

## 2023-06-06 ENCOUNTER — Encounter: Payer: Self-pay | Admitting: Podiatry

## 2023-06-07 NOTE — Telephone Encounter (Signed)
Attempted to call patient back, no answer. Left VM

## 2023-06-08 NOTE — Telephone Encounter (Signed)
 Called patient and went over circulation test.   Dawn, can you schedule him toward the end of next week? Thanks!

## 2023-06-17 ENCOUNTER — Ambulatory Visit: Admitting: Podiatry

## 2023-06-17 ENCOUNTER — Encounter: Payer: Self-pay | Admitting: Podiatry

## 2023-06-17 DIAGNOSIS — G5762 Lesion of plantar nerve, left lower limb: Secondary | ICD-10-CM | POA: Diagnosis not present

## 2023-06-17 DIAGNOSIS — M7752 Other enthesopathy of left foot: Secondary | ICD-10-CM

## 2023-06-17 DIAGNOSIS — I739 Peripheral vascular disease, unspecified: Secondary | ICD-10-CM | POA: Diagnosis not present

## 2023-06-20 NOTE — Progress Notes (Signed)
 Subjective: Chief Complaint  Patient presents with   Follow-up    follow up on circulation results discussion    78 year old male presents the office for follow evaluation of foot pain.  I did contact him about his circulation test results and discussed with vascular.  He presents to discuss options.  No significant change.   Objective: AAO x3, NAD DP/PT pulses palpable bilaterally, CRT less than 3 seconds There is still the majority of tenderness to palpation along submetatarsal left foot mostly on  2nd interspace today.  No significant pain on the left third interspace today but there is some discomfort submetatarsal 3.  There is no other areas of discomfort identified today.  No pain with calf compression, swelling, warmth, erythema  Assessment: Bursitis, neuroma  Plan: -All treatment options discussed with the patient including all alternatives, risks, complications.  -We have again had a discussion regards to treatment options both surgically as well as conservative.  Given his circulation this has been significant increased risk of complications postoperatively which she is well aware of.  We are going to try a few weeks of shockwave treatment.  I will see him next week for this.  Vivi Barrack DPM

## 2023-06-22 ENCOUNTER — Ambulatory Visit: Admitting: Podiatry

## 2023-06-22 DIAGNOSIS — M7752 Other enthesopathy of left foot: Secondary | ICD-10-CM

## 2023-06-22 DIAGNOSIS — G5762 Lesion of plantar nerve, left lower limb: Secondary | ICD-10-CM | POA: Diagnosis not present

## 2023-06-23 ENCOUNTER — Telehealth: Payer: Self-pay | Admitting: Podiatry

## 2023-06-23 NOTE — Telephone Encounter (Signed)
 DOS: 07/20/23  NEURECTOMY LT    EFFECTIVE DATE: 03/16/23  PER HALEY M OF [facility]TA NO PRIO AUTH IS REQ FOR CPT CODE 19147  REF# [id number]

## 2023-06-27 NOTE — Progress Notes (Signed)
 Subjective: No chief complaint on file.    78 year old male presents the office for follow evaluation of foot pain.  He presents today for shockwave treatment.  No other concerns today.    Objective: AAO x3, NAD DP/PT pulses palpable bilaterally, CRT less than 3 seconds There is still the majority of tenderness to palpation along submetatarsal left foot mostly on  2nd interspace today.  No significant pain on the left third interspace today but there is some discomfort submetatarsal 3.  Prominent metatarsal head noted.  There is no other areas of discomfort identified today.  No pain with calf compression, swelling, warmth, erythema  Assessment: Bursitis, neuroma  Plan: -All treatment options discussed with the patient including all alternatives, risks, complications.  -We have attempted numerous conservative treatments he continues to have symptoms.  Given his circulation we will try to continue with conservative management for now.  He did to try EPAT although this is not a guarantee we discussed.  [employer] precautions we did perform EPAT 2500 shocks with an energey of 60, 8. Tolerated well. Will plan for a second treatment next week.   Charity Conch DPM

## 2023-06-29 ENCOUNTER — Ambulatory Visit: Admitting: Podiatry

## 2023-06-29 DIAGNOSIS — G5762 Lesion of plantar nerve, left lower limb: Secondary | ICD-10-CM

## 2023-06-29 DIAGNOSIS — M7752 Other enthesopathy of left foot: Secondary | ICD-10-CM

## 2023-07-03 NOTE — Progress Notes (Signed)
 Subjective: Chief Complaint  Patient presents with   Foot Pain    EPAT     78 year old male presents the office for follow evaluation of foot pain.  He presents today for shockwave treatment.  States that he had relief of pain for about 2 days after the last treatment.  No other complications noted.  No other concerns today.    Objective: AAO x3, NAD DP/PT pulses palpable bilaterally, CRT less than 3 seconds There is still the tenderness to palpation along submetatarsal left foot mostly on  2nd interspace today.  No significant pain on the left third interspace today but there is some discomfort submetatarsal 3.  Prominent metatarsal head noted.  There is no other areas of discomfort identified today.  No pain with calf compression, swelling, warmth, erythema  Assessment: Bursitis, neuroma  Plan: -All treatment options discussed with the patient including all alternatives, risks, complications.  -We have attempted numerous conservative treatments he continues to have symptoms.  Given his circulation we will try to continue with conservative management for now.  We did perform the second shockwave treatment today. We did perform EPAT 2500 shocks with an energey of 80, 8. Tolerated well. Will plan for a third treatment next week.   Charity Conch DPM

## 2023-07-05 ENCOUNTER — Ambulatory Visit: Admitting: Podiatry

## 2023-07-05 DIAGNOSIS — G5762 Lesion of plantar nerve, left lower limb: Secondary | ICD-10-CM | POA: Diagnosis not present

## 2023-07-05 DIAGNOSIS — M7752 Other enthesopathy of left foot: Secondary | ICD-10-CM

## 2023-07-06 NOTE — Progress Notes (Signed)
 Subjective: No chief complaint on file.  78 year old male presents the office for follow evaluation of foot pain.  States that he did not see significant improvement after last treatment with the shockwave.  He wants to proceed with surgery.  Does not report any recent injury or changes.    Objective: AAO x3, NAD DP/PT pulses palpable bilaterally, CRT less than 3 seconds There is still the tenderness to palpation along submetatarsal left foot mostly on  2nd interspace today but also to the third interspace.  Prominent metatarsal head noted results on hyperkeratotic lesion.  There is no other areas of discomfort identified today.  No pain with calf compression, swelling, warmth, erythema  Assessment: Bursitis, neuroma  Plan: -All treatment options discussed with the patient including all alternatives, risks, complications.  -We have attempted numerous conservative treatments he continues to have symptoms.  Given his circulation we will try to continue with conservative management for now.  We did perform the third shockwave treatment today. We did perform EPAT 2500 shocks with an energey of 100, 90. Tolerated well.  - We do long discussion today in regards to surgery.  We have discussed this on numerous occasions and he is scheduled for surgery already.  We discussed again that he is at high risk of complications given his circulation which could lead to infection, amputation.  The patient told me I need to stop wearing about healing and he will be fine.  He is well aware of the risks of surgery.  Previous discussed with vascular as well.  Will likely proceed with surgery as scheduled as he is well aware the risks.  Outside of the risks of surgery discussed that he could continue to have pain after surgery.  We again reviewed the surgery discussed postoperative course.  He has no further questions or concerns.  Charity Conch DPM   Charity Conch DPM

## 2023-07-12 ENCOUNTER — Encounter: Payer: Self-pay | Admitting: Podiatry

## 2023-07-12 ENCOUNTER — Telehealth: Payer: Self-pay | Admitting: Podiatry

## 2023-07-12 NOTE — Telephone Encounter (Signed)
 Received call from [facility] Craig Peters stating they spoke to pt and he is postponing his surgery.  Contacted pt and he is postponing his surgery as he is feeling better. I have scheduled him for another shockwave therapy for 07/21/23 at 800am per Dr Clydia Dart.

## 2023-07-12 NOTE — Telephone Encounter (Signed)
 Can you call and make sure he going to cancel? Thanks!

## 2023-07-21 ENCOUNTER — Ambulatory Visit: Admitting: Podiatry

## 2023-07-21 DIAGNOSIS — G5762 Lesion of plantar nerve, left lower limb: Secondary | ICD-10-CM | POA: Diagnosis not present

## 2023-07-21 DIAGNOSIS — M7752 Other enthesopathy of left foot: Secondary | ICD-10-CM

## 2023-07-21 NOTE — Progress Notes (Signed)
 Subjective: Chief Complaint  Patient presents with   EPAT    Patient reports 80%-90% improvement since last treatment. Today is 4th treatment. Energy level started at 100 and ended at 110. 2000 shockwaves delivered.     78 year old male presents the office for follow evaluation of foot pain.  States he has had significant improvement compared to last appointment.  Therefore he is held off on surgery because of the significant treatment.  He is about 89% improved.   Objective: AAO x3, NAD DP/PT pulses palpable bilaterally, CRT less than 3 seconds There is still some mild distal tenderness palpation mostly in the second or of the third interspace.  Prominent metatarsal heads noted with atrophy of the fat pad.  There is no open lesions.  No edema.  No area pinpoint tenderness otherwise. No pain with calf compression, swelling, warmth, erythema  Assessment: Bursitis, neuroma  Plan: -All treatment options discussed with the patient including all alternatives, risks, complications.  -We have attempted numerous conservative treatments he continues to have symptoms.  He has been doing better.  Completed a fourth treatment of EPAT today. -We did perform EPAT 200 shocks with an energey of 110, 100. Tolerated well.  -He has been doing well, will hold off on surgery today.   Return in about 4 weeks (around 08/18/2023) for EPAT at 8am .  Charity Conch DPM

## 2023-07-25 ENCOUNTER — Encounter: Admitting: Podiatry

## 2023-08-04 ENCOUNTER — Encounter: Admitting: Podiatry

## 2023-08-18 ENCOUNTER — Encounter: Admitting: Podiatry

## 2023-08-19 ENCOUNTER — Ambulatory Visit: Admitting: Podiatry

## 2023-08-19 DIAGNOSIS — M216X2 Other acquired deformities of left foot: Secondary | ICD-10-CM

## 2023-08-19 DIAGNOSIS — G5762 Lesion of plantar nerve, left lower limb: Secondary | ICD-10-CM | POA: Diagnosis not present

## 2023-08-20 NOTE — Progress Notes (Signed)
 Subjective: Chief Complaint  Patient presents with   Foot Pain    RM#13 Patient here for EPAT    [facility] EPAT    5th treatment. Left foot. Start and End energy level: 120 mJ, frequency: 10 hz, total shocks: 2000. Patient will return in 2 weeks for 6th treatment.      78 year old male presents the office for follow evaluation of foot pain.  States that he has been doing well.  He states that around the 2-week mark is when the pain starts to come back and is asking to do a couple more treatments more frequently. He does not report any injuries or new concerns.    Objective: AAO x3, NAD DP/PT pulses palpable bilaterally, CRT less than 3 seconds There is still some mild distal tenderness palpation mostly in the second or of the third interspace.  Prominent metatarsal heads noted with atrophy of the fat pad.  Hyperkeratotic lesion submetatarsal 3 without any underlying ulceration, drainage or signs of infection. No pain with calf compression, swelling, warmth, erythema  Assessment: Bursitis, neuroma; prominent metatarsal head resulting hyperkeratotic lesion  Plan: -All treatment options discussed with the patient including all alternatives, risks, complications.  -We have attempted numerous conservative treatments he continues to have symptoms.  He has been doing better.  Completed treatment today with the above energy, frequency. -I sharply debrided the hyperkeratotic lesion without any complications or bleeding.  Discussed moisturizer, offloading.  He does also contribute to his symptoms. -Right hold off on any surgical intervention at this time given concern for circulation.  Return in about 2 weeks (around 09/02/2023) for shockwave.  Charity Conch DPM

## 2023-09-06 ENCOUNTER — Ambulatory Visit: Admitting: Podiatry

## 2023-09-06 DIAGNOSIS — M7752 Other enthesopathy of left foot: Secondary | ICD-10-CM

## 2023-09-06 DIAGNOSIS — G5762 Lesion of plantar nerve, left lower limb: Secondary | ICD-10-CM | POA: Diagnosis not present

## 2023-09-06 NOTE — Progress Notes (Signed)
 Subjective: Chief Complaint  Patient presents with   [facility] EPAT    Doing well after the last treatment. Left foot. Start energy level: 120 mJ, frequency: 10 hz, End energy level: 130 mJ, frequency: 10 hx. total shocks: 2000. Patient will return in 3 weeks for 7th treatment per  Dr. Gershon.      78 year old male presents the office for follow evaluation of foot pain.  States that he has been doing well.  He is very happy with the outcome of the EPAT treatment so far.   Objective: AAO x3, NAD DP/PT pulses palpable bilaterally, CRT less than 3 seconds There is no significant tenderness to along the second or of the third interspace.  Prominent metatarsal heads noted with atrophy of the fat pad.  No significant callus formation today. No pain with calf compression, swelling, warmth, erythema  Assessment: Bursitis, neuroma; prominent metatarsal head resulting hyperkeratotic lesion  Plan: -All treatment options discussed with the patient including all alternatives, risks, complications.  -We have attempted numerous conservative treatments he continues to have symptoms.  He has been doing better.  Completed treatment today with the above energy, frequency. -Right hold off on any surgical intervention at this time given concern for circulation.  No follow-ups on file.  Craig Peters Gershon DPM

## 2023-09-08 DIAGNOSIS — H18053 Posterior corneal pigmentations, bilateral: Secondary | ICD-10-CM | POA: Diagnosis not present

## 2023-09-08 DIAGNOSIS — H524 Presbyopia: Secondary | ICD-10-CM | POA: Diagnosis not present

## 2023-09-08 DIAGNOSIS — H52223 Regular astigmatism, bilateral: Secondary | ICD-10-CM | POA: Diagnosis not present

## 2023-09-08 DIAGNOSIS — H2513 Age-related nuclear cataract, bilateral: Secondary | ICD-10-CM | POA: Diagnosis not present

## 2023-09-08 DIAGNOSIS — H25043 Posterior subcapsular polar age-related cataract, bilateral: Secondary | ICD-10-CM | POA: Diagnosis not present

## 2023-09-08 DIAGNOSIS — H35372 Puckering of macula, left eye: Secondary | ICD-10-CM | POA: Diagnosis not present

## 2023-09-08 DIAGNOSIS — H25013 Cortical age-related cataract, bilateral: Secondary | ICD-10-CM | POA: Diagnosis not present

## 2023-09-08 DIAGNOSIS — H5213 Myopia, bilateral: Secondary | ICD-10-CM | POA: Diagnosis not present

## 2023-09-19 DIAGNOSIS — R7301 Impaired fasting glucose: Secondary | ICD-10-CM | POA: Diagnosis not present

## 2023-09-19 DIAGNOSIS — E7800 Pure hypercholesterolemia, unspecified: Secondary | ICD-10-CM | POA: Diagnosis not present

## 2023-09-19 DIAGNOSIS — I10 Essential (primary) hypertension: Secondary | ICD-10-CM | POA: Diagnosis not present

## 2023-09-20 DIAGNOSIS — E7800 Pure hypercholesterolemia, unspecified: Secondary | ICD-10-CM | POA: Diagnosis not present

## 2023-09-20 DIAGNOSIS — Z720 Tobacco use: Secondary | ICD-10-CM | POA: Diagnosis not present

## 2023-09-20 DIAGNOSIS — Z0000 Encounter for general adult medical examination without abnormal findings: Secondary | ICD-10-CM | POA: Diagnosis not present

## 2023-09-20 DIAGNOSIS — Z23 Encounter for immunization: Secondary | ICD-10-CM | POA: Diagnosis not present

## 2023-09-20 DIAGNOSIS — I10 Essential (primary) hypertension: Secondary | ICD-10-CM | POA: Diagnosis not present

## 2023-09-20 DIAGNOSIS — C61 Malignant neoplasm of prostate: Secondary | ICD-10-CM | POA: Diagnosis not present

## 2023-09-27 ENCOUNTER — Ambulatory Visit: Admitting: Podiatry

## 2023-09-27 ENCOUNTER — Encounter: Payer: Self-pay | Admitting: Podiatry

## 2023-09-27 VITALS — BP 128/64 | HR 64 | Temp 97.3°F | Resp 16 | Ht 72.0 in | Wt 180.0 lb

## 2023-09-27 DIAGNOSIS — G5762 Lesion of plantar nerve, left lower limb: Secondary | ICD-10-CM | POA: Diagnosis not present

## 2023-09-27 DIAGNOSIS — M7752 Other enthesopathy of left foot: Secondary | ICD-10-CM | POA: Diagnosis not present

## 2023-09-27 NOTE — Progress Notes (Signed)
 Subjective: Chief Complaint  Patient presents with   Foot Pain    Patient is here for Epap treatment for foot pain left    78 year old male presents the office for follow evaluation of foot pain.  States that he has been doing well.  He is very happy with the outcome of the EPAT treatment and he is not having any significant pain at this time.  No recent injuries or changes.   Objective: AAO x3, NAD DP/PT pulses palpable bilaterally, CRT less than 3 seconds There is no tenderness to along the second or of the third interspace.  Prominent metatarsal heads noted with atrophy of the fat pad.  There is some callus formation submetatarsal today without any underlying ulceration, drainage or signs of infection. No pain with calf compression, swelling, warmth, erythema  Assessment: Bursitis, neuroma; prominent metatarsal head resulting hyperkeratotic lesion  Plan: -All treatment options discussed with the patient including all alternatives, risks, complications.  -I sharply debrided the hyperkeratotic lesion any complications or bleeding.  We discussed treatment options at this time since he is not having any pain we mutually agreed on any further shockwave treatment.  Continue offloading and supportive shoe gear.  Return if symptoms worsen or fail to improve.  Donnice JONELLE Fees DPM

## 2023-10-06 ENCOUNTER — Encounter: Payer: Self-pay | Admitting: *Deleted

## 2023-10-26 ENCOUNTER — Telehealth: Payer: Self-pay | Admitting: Acute Care

## 2023-10-26 DIAGNOSIS — Z87891 Personal history of nicotine dependence: Secondary | ICD-10-CM

## 2023-10-26 DIAGNOSIS — Z122 Encounter for screening for malignant neoplasm of respiratory organs: Secondary | ICD-10-CM

## 2023-10-26 NOTE — Telephone Encounter (Signed)
 Lung Cancer Screening Narrative/Criteria Questionnaire (Cigarette Smokers Only- No Cigars/Pipes/vapes)   Craig Peters   SDMV:10/27/23 at 0915/[name]                                           01/25/1946                       LDCT: 10/31/23 at 0900a /[name]    78 y.o.   Phone: [phone]  Lung Screening Narrative (confirm age 19-77 yrs Medicare / 50-80 yrs Private pay insurance)   Insurance information:HT[employer]   Referring Provider:[facility]   This screening involves an initial phone call with a team member from our program. It is called a shared decision making visit. The initial meeting is required by insurance and Medicare to make sure you understand the program. This appointment takes about 15-20 minutes to complete. The CT scan will completed at a separate date/time. This scan takes about 5-10 minutes to complete and you may eat and drink before and after the scan.  Criteria questions for Lung Cancer Screening:   Are you a current or former smoker? Former Age began smoking: 17y   If you are a former smoker, what year did you quit smoking? 2014   To calculate your smoking history, I need an accurate estimate of how many packs of cigarettes you smoked per day and for how many years. (Not just the number of PPD you are now smoking)   Years smoking 50 x Packs per day 1 = Pack years 50   (at least 20 pack yrs)   (Make sure they understand that we need to know how much they have smoked in the past, not just the number of PPD they are smoking now)  Do you have a personal history of cancer?  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      Do you have a family history of cancer? No (possible GMA but not sure)  Are you coughing up blood?  No  Have you had unexplained weight loss of 15 lbs or more in the last 6 months? No  It looks like you meet all criteria.     Additional information: N/A

## 2023-10-27 ENCOUNTER — Encounter: Payer: Self-pay | Admitting: Adult Health

## 2023-10-27 ENCOUNTER — Ambulatory Visit (INDEPENDENT_AMBULATORY_CARE_PROVIDER_SITE_OTHER): Admitting: Adult Health

## 2023-10-27 DIAGNOSIS — I10 Essential (primary) hypertension: Secondary | ICD-10-CM | POA: Diagnosis not present

## 2023-10-27 DIAGNOSIS — Z87891 Personal history of nicotine dependence: Secondary | ICD-10-CM

## 2023-10-27 NOTE — Progress Notes (Signed)
  Virtual Visit via Telephone Note  I connected with Craig Peters , 10/27/23 9:14 AM by a telemedicine application and verified that I am speaking with the correct person using two identifiers.  Location: Patient: home Provider: home   I discussed the limitations of evaluation and management by telemedicine and the availability of in person appointments. The patient expressed understanding and agreed to proceed.   Shared Decision Making Visit Lung Cancer Screening Program [phone])   Eligibility: 78 y.o. Pack Years Smoking History Calculation = 50 pack years  (# packs/per year x # years smoked) Recent History of coughing up blood  no Unexplained weight loss? no ( >Than 15 pounds within the last 6 months ) Prior History Lung / other cancer no (Diagnosis within the last 5 years already requiring surveillance chest CT Scans). Smoking Status Former Smoker Former Smokers: Years since quit: 11 years  Quit Date: 2014  Visit Components: Discussion included one or more decision making aids. YES Discussion included risk/benefits of screening. YES Discussion included potential follow up diagnostic testing for abnormal scans. YES Discussion included meaning and risk of over diagnosis. YES Discussion included meaning and risk of False Positives. YES Discussion included meaning of total radiation exposure. YES  Counseling Included: Importance of adherence to annual lung cancer LDCT screening. YES Impact of comorbidities on ability to participate in the program. YES Ability and willingness to under diagnostic treatment. YES  Smoking Cessation Counseling: Former Smokers:  Discussed the importance of maintaining cigarette abstinence. yes Diagnosis Code: Personal History of Nicotine Dependence. S12.108 Information about tobacco cessation classes and interventions provided to patient. yes Patient provided with ticket for LDCT Scan. yes Written Order for Lung Cancer Screening with  LDCT placed in Epic. Yes (CT Chest Lung Cancer Screening Low Dose W/O CM) [id number]  Z12.2-Screening of respiratory organs Z87.891-Personal history of nicotine dependence   [name] 10/27/23

## 2023-10-27 NOTE — Patient Instructions (Signed)

## 2023-10-31 ENCOUNTER — Inpatient Hospital Stay
Admission: RE | Admit: 2023-10-31 | Discharge: 2023-10-31 | Disposition: A | Discharge status: Home / Self Care | Source: Ambulatory Visit | Attending: Acute Care | Admitting: Acute Care

## 2023-10-31 DIAGNOSIS — Z87891 Personal history of nicotine dependence: Secondary | ICD-10-CM | POA: Diagnosis not present

## 2023-10-31 DIAGNOSIS — Z122 Encounter for screening for malignant neoplasm of respiratory organs: Secondary | ICD-10-CM

## 2023-12-01 DIAGNOSIS — C61 Malignant neoplasm of prostate: Secondary | ICD-10-CM | POA: Diagnosis not present

## 2023-12-01 DIAGNOSIS — N401 Enlarged prostate with lower urinary tract symptoms: Secondary | ICD-10-CM | POA: Diagnosis not present

## 2023-12-01 DIAGNOSIS — R350 Frequency of micturition: Secondary | ICD-10-CM | POA: Diagnosis not present

## 2024-01-19 DIAGNOSIS — E7800 Pure hypercholesterolemia, unspecified: Secondary | ICD-10-CM | POA: Diagnosis not present
